# Patient Record
Sex: Female | Born: 1965 | Race: White | Hispanic: No | Smoking: Former smoker
Health system: Southern US, Community
[De-identification: ages and names within clinical notes are randomized; demographics above are authoritative.]

## PROBLEM LIST (undated history)

## (undated) DIAGNOSIS — F1120 Opioid dependence, uncomplicated: Secondary | ICD-10-CM

## (undated) DIAGNOSIS — G5600 Carpal tunnel syndrome, unspecified upper limb: Secondary | ICD-10-CM

## (undated) DIAGNOSIS — F4310 Post-traumatic stress disorder, unspecified: Secondary | ICD-10-CM

## (undated) DIAGNOSIS — G90529 Complex regional pain syndrome I of unspecified lower limb: Secondary | ICD-10-CM

## (undated) DIAGNOSIS — F17200 Nicotine dependence, unspecified, uncomplicated: Secondary | ICD-10-CM

## (undated) DIAGNOSIS — G894 Chronic pain syndrome: Secondary | ICD-10-CM

## (undated) HISTORY — DX: Chronic pain syndrome: G89.4

## (undated) HISTORY — DX: Opioid dependence, uncomplicated: F11.20

## (undated) HISTORY — DX: Nicotine dependence, unspecified, uncomplicated: F17.200

## (undated) HISTORY — DX: Complex regional pain syndrome i of unspecified lower limb: G90.529

## (undated) HISTORY — PX: COSMETIC SURGERY: SHX468

## (undated) HISTORY — DX: Post-traumatic stress disorder, unspecified: F43.10

## (undated) HISTORY — DX: Carpal tunnel syndrome, unspecified upper limb: G56.00

---

## 1989-06-17 HISTORY — PX: TUBAL LIGATION: SHX77

## 2005-09-18 ENCOUNTER — Emergency Department (HOSPITAL_COMMUNITY): Admission: EM | Admit: 2005-09-18 | Discharge: 2005-09-19 | Payer: Self-pay | Admitting: Emergency Medicine

## 2006-02-10 ENCOUNTER — Ambulatory Visit: Payer: Self-pay | Admitting: Family Medicine

## 2006-02-24 ENCOUNTER — Ambulatory Visit: Payer: Self-pay | Admitting: Family Medicine

## 2006-03-25 DIAGNOSIS — Z87891 Personal history of nicotine dependence: Secondary | ICD-10-CM | POA: Insufficient documentation

## 2006-03-25 DIAGNOSIS — F339 Major depressive disorder, recurrent, unspecified: Secondary | ICD-10-CM | POA: Insufficient documentation

## 2007-01-16 ENCOUNTER — Ambulatory Visit: Payer: Self-pay | Admitting: Family Medicine

## 2007-01-16 LAB — CONVERTED CEMR LAB
Nitrite: NEGATIVE
Specific Gravity, Urine: 1.01
WBC Urine, dipstick: NEGATIVE

## 2007-01-17 ENCOUNTER — Encounter: Payer: Self-pay | Admitting: Family Medicine

## 2007-01-21 ENCOUNTER — Encounter: Payer: Self-pay | Admitting: Family Medicine

## 2007-01-21 ENCOUNTER — Ambulatory Visit: Payer: Self-pay | Admitting: Family Medicine

## 2007-01-21 ENCOUNTER — Other Ambulatory Visit: Admission: RE | Admit: 2007-01-21 | Discharge: 2007-01-21 | Payer: Self-pay | Admitting: Family Medicine

## 2007-01-21 DIAGNOSIS — N951 Menopausal and female climacteric states: Secondary | ICD-10-CM | POA: Insufficient documentation

## 2007-01-28 ENCOUNTER — Ambulatory Visit: Payer: Self-pay | Admitting: Family Medicine

## 2007-02-02 ENCOUNTER — Encounter: Payer: Self-pay | Admitting: Family Medicine

## 2007-02-02 ENCOUNTER — Telehealth: Payer: Self-pay | Admitting: Family Medicine

## 2007-02-02 LAB — CONVERTED CEMR LAB
ALT: 15 units/L (ref 0–35)
Alkaline Phosphatase: 71 units/L (ref 39–117)
Creatinine, Ser: 0.9 mg/dL (ref 0.40–1.20)
Glucose, Bld: 118 mg/dL — ABNORMAL HIGH (ref 70–99)
HDL goal, serum: 40 mg/dL
LDL Cholesterol: 82 mg/dL (ref 0–99)
LH: 34.7 milliintl units/mL
MCHC: 34.4 g/dL (ref 30.0–36.0)
MCV: 97 fL (ref 78.0–100.0)
Platelets: 279 10*3/uL (ref 150–400)
Sodium: 143 meq/L (ref 135–145)
Total Bilirubin: 0.4 mg/dL (ref 0.3–1.2)
Total CHOL/HDL Ratio: 2.6
Total Protein: 6.8 g/dL (ref 6.0–8.3)
Triglycerides: 114 mg/dL (ref <150)
VLDL: 23 mg/dL (ref 0–40)

## 2007-02-24 ENCOUNTER — Ambulatory Visit: Payer: Self-pay | Admitting: Family Medicine

## 2007-02-24 ENCOUNTER — Encounter: Admission: RE | Admit: 2007-02-24 | Discharge: 2007-02-24 | Payer: Self-pay | Admitting: Family Medicine

## 2007-02-24 ENCOUNTER — Telehealth: Payer: Self-pay | Admitting: Family Medicine

## 2008-06-01 ENCOUNTER — Encounter: Payer: Self-pay | Admitting: Family Medicine

## 2008-06-01 ENCOUNTER — Ambulatory Visit: Payer: Self-pay | Admitting: Family Medicine

## 2008-06-01 ENCOUNTER — Other Ambulatory Visit: Admission: RE | Admit: 2008-06-01 | Discharge: 2008-06-01 | Payer: Self-pay | Admitting: Family Medicine

## 2008-06-02 ENCOUNTER — Encounter: Payer: Self-pay | Admitting: Family Medicine

## 2008-06-02 LAB — CONVERTED CEMR LAB: Trich, Wet Prep: NONE SEEN

## 2008-06-16 ENCOUNTER — Encounter: Payer: Self-pay | Admitting: Family Medicine

## 2008-06-20 LAB — CONVERTED CEMR LAB
Albumin: 4.3 g/dL (ref 3.5–5.2)
Chloride: 107 meq/L (ref 96–112)
Cholesterol: 208 mg/dL — ABNORMAL HIGH (ref 0–200)
Total Protein: 7 g/dL (ref 6.0–8.3)
Triglycerides: 104 mg/dL (ref <150)

## 2008-07-26 ENCOUNTER — Telehealth: Payer: Self-pay | Admitting: Family Medicine

## 2008-10-12 ENCOUNTER — Telehealth: Payer: Self-pay | Admitting: Family Medicine

## 2008-10-17 ENCOUNTER — Ambulatory Visit: Payer: Self-pay | Admitting: Family Medicine

## 2008-10-18 LAB — CONVERTED CEMR LAB
Iron: 115 ug/dL (ref 42–145)
Saturation Ratios: 46 % (ref 20–55)
TIBC: 251 ug/dL (ref 250–470)
TSH: 1.33 microintl units/mL (ref 0.350–4.500)
UIBC: 136 ug/dL

## 2008-10-21 ENCOUNTER — Encounter: Payer: Self-pay | Admitting: Family Medicine

## 2008-10-21 ENCOUNTER — Telehealth (INDEPENDENT_AMBULATORY_CARE_PROVIDER_SITE_OTHER): Payer: Self-pay | Admitting: *Deleted

## 2008-11-29 ENCOUNTER — Encounter: Payer: Self-pay | Admitting: Family Medicine

## 2009-04-17 ENCOUNTER — Encounter: Admission: RE | Admit: 2009-04-17 | Discharge: 2009-04-17 | Payer: Self-pay | Admitting: Family Medicine

## 2009-04-17 ENCOUNTER — Encounter: Payer: Self-pay | Admitting: Family Medicine

## 2009-04-17 LAB — HM MAMMOGRAPHY: HM Mammogram: NORMAL

## 2009-08-17 ENCOUNTER — Encounter: Payer: Self-pay | Admitting: Emergency Medicine

## 2009-08-17 ENCOUNTER — Inpatient Hospital Stay (HOSPITAL_COMMUNITY): Admission: EM | Admit: 2009-08-17 | Discharge: 2009-08-19 | Payer: Self-pay | Admitting: Emergency Medicine

## 2009-08-17 ENCOUNTER — Ambulatory Visit: Payer: Self-pay | Admitting: Diagnostic Radiology

## 2009-08-25 HISTORY — PX: KNEE SURGERY: SHX244

## 2009-10-23 ENCOUNTER — Ambulatory Visit (HOSPITAL_COMMUNITY): Admission: RE | Admit: 2009-10-23 | Discharge: 2009-10-24 | Payer: Self-pay | Admitting: Orthopedic Surgery

## 2010-01-10 ENCOUNTER — Ambulatory Visit (HOSPITAL_BASED_OUTPATIENT_CLINIC_OR_DEPARTMENT_OTHER): Admission: RE | Admit: 2010-01-10 | Discharge: 2010-01-10 | Payer: Self-pay | Admitting: Orthopedic Surgery

## 2010-01-23 ENCOUNTER — Ambulatory Visit: Payer: Self-pay | Admitting: Family Medicine

## 2010-01-23 DIAGNOSIS — F438 Other reactions to severe stress: Secondary | ICD-10-CM | POA: Insufficient documentation

## 2010-01-26 ENCOUNTER — Encounter (INDEPENDENT_AMBULATORY_CARE_PROVIDER_SITE_OTHER): Payer: Self-pay | Admitting: *Deleted

## 2010-01-26 ENCOUNTER — Telehealth: Payer: Self-pay | Admitting: Family Medicine

## 2010-03-07 ENCOUNTER — Ambulatory Visit: Payer: Self-pay | Admitting: Family Medicine

## 2010-04-27 ENCOUNTER — Telehealth: Payer: Self-pay | Admitting: Family Medicine

## 2010-06-17 ENCOUNTER — Encounter: Payer: Self-pay | Admitting: Family Medicine

## 2010-07-09 ENCOUNTER — Other Ambulatory Visit: Payer: Self-pay | Admitting: Obstetrics and Gynecology

## 2010-07-17 NOTE — Assessment & Plan Note (Signed)
Summary: DEPRESSED   Vital Signs:  Patient profile:   45 year old female Height:      65 inches Weight:      174 pounds BMI:     29.06 Pulse rate:   117 / minute BP sitting:   144 / 89  (left arm) Cuff size:   regular  Vitals Entered By: Kathlene November (January 23, 2010 2:44 PM) CC: pt extremely anxious and tearful- had 3 knee surgeries in 3 months- work harassing her in regards to this and states mentally she is disturbed   Primary Care Provider:  Linford Arnold, C  CC:  pt extremely anxious and tearful- had 3 knee surgeries in 3 months- work harassing her in regards to this and states mentally she is disturbed.  History of Present Illness: pt extremely anxious and tearful- had 3 knee surgeries in 3 months- work harassing her in regards to this and states mentally she is disturbed.  Dr. Merlyn Albert released her to go back to work.  Sits at work.  work has sent her home and changed her schedule.  Then she was sent to the workers comp doc at work.  He also recommend she is able to work and only needs PT. She is worried she is going to be fired. She is so stressed out she is not sleeping.  Her mind is racing.  Tried to get some mental health resources at work but it was denied.    Current Medications (verified): 1)  Methocarbamol 500 Mg Tabs (Methocarbamol) .... Take One Tablet By Mouth Once A Day 2)  Lyrica 75 Mg Caps (Pregabalin) .... Take One Tablet By Mouth Twice A Day 3)  Oxycodone Hcl 5 Mg Tabs (Oxycodone Hcl) .... Take One -Two Tablets By Mouth Every 6 Hours As Needed For Pain  Allergies (verified): 1)  ! Codeine  Physical Exam  General:  Well-developed,well-nourished,in no acute distress; alert,appropriate and cooperative throughout examination Msk:  INcision is healing well on the left knee. Some steri-strips still in place.  Neurologic:  limping becaue of left  knee Psych:  Oriented X3, normally interactive, good eye contact, tearful, and slightly anxious.     Impression &  Recommendations:  Problem # 1:  OTHER ACUTE REACTIONS TO STRESS (ICD-308.3) Discussed that she is definitely having an acute stress reaction to what is doing on at work.  Will refer to [facility] for counseling. Also discussed starting a medication. Start citalprom. Warned about potential SE. F/U in 3 weeks. Written out of work for the rest of this week.. As emotionally she needs a break and is clearly very overwhelmed.   Orders: Psychology Referral (Psychology)  Complete Medication List: 1)  Methocarbamol 500 Mg Tabs (Methocarbamol) .... Take one tablet by mouth once a day 2)  Lyrica 75 Mg Caps (Pregabalin) .... Take one tablet by mouth twice a day 3)  Oxycodone Hcl 5 Mg Tabs (Oxycodone hcl) .... Take one -two tablets by mouth every 6 hours as needed for pain 4)  Citalopram Hydrobromide 20 Mg Tabs (Citalopram hydrobromide) .... 1/2 tab daily by mouth for one week, then increase to whole tab daily.  Patient Instructions: 1)  Please schedule a follow-up appointment in 3 weeks to se if the medication is helping.  Prescriptions: CITALOPRAM HYDROBROMIDE 20 MG TABS (CITALOPRAM HYDROBROMIDE) 1/2 tab daily by mouth for one week, then increase to whole tab daily.  #30 x 1   Entered and Authorized by:   Nani Gasser MD   Signed by:  Nani Gasser MD on 01/23/2010   Method used:   [facility] [phone][facility] (retail[facility]       [address]       [place], [place]  [address]       Ph: [phone] or [phone]       Fax: [phone]   RxID:   [id number]

## 2010-07-17 NOTE — Progress Notes (Signed)
Summary: meds  Phone Note Call from Patient   Caller: Patient Call For: Nani Gasser MD Summary of Call: pt called and states she wants a refill on chantix.[facility] she states they "over[facility]" her and she doesnt want to deal with them anymore and wants a refill sent to [facility] Initial call taken by: Avon Gully CMA, Duncan Dull),  April 27, 2010 2:51 PM    Prescriptions: CHANTIX CONTINUING MONTH PAK 1 MG TABS (VARENICLINE TARTRATE) take as directed  #1 month x 2   Entered and Authorized by:   Nani Gasser MD   Signed by:   Nani Gasser MD on 04/27/2010   Method used:   Electronically to        [facility] [phone]* (retail)       [address] [place], [place]  [address]       Ph: [phone]       Fax: [phone]   RxID:   [id number]

## 2010-07-17 NOTE — Progress Notes (Signed)
Summary: meds not working  Phone Note Call from Patient   Caller: Patient Call For: Nani Gasser MD Summary of Call: pt states the citalapram 20 mg is not working.Pt is having anxiety and heart palpitations.Wants to know what eles she can take.Pt is having bad nightmares and cant sleep Initial call taken by: Avon Gully CMA, Duncan Dull),  January 26, 2010 2:26 PM  Follow-up for Phone Call        Keep taking the citalpram as he likely hasn't started working but for  rescue med can start klonopin.  Can use this safely with the ciralopram.  Follow-up by: Nani Gasser MD,  January 26, 2010 2:59 PM  Additional Follow-up for Phone Call Additional follow up Details #1::        pt notified.pt wants a note to be out of work until wed Additional Follow-up by: Avon Gully CMA, Duncan Dull),  January 26, 2010 3:11 PM    Additional Follow-up for Phone Call Additional follow up Details #2::    That is fine.  Follow-up by: Nani Gasser MD,  January 26, 2010 3:23 PM  New/Updated Medications: CLONAZEPAM 0.5 MG TBDP (CLONAZEPAM) Take 1 tablet by mouth two times a day as needed for anxiety Prescriptions: CLONAZEPAM 0.5 MG TBDP (CLONAZEPAM) Take 1 tablet by mouth two times a day as needed for anxiety  #30 x 0   Entered and Authorized by:   Nani Gasser MD   Signed by:   Nani Gasser MD on 01/26/2010   Method used:   Printed then faxed to ...       [facility]  [address] [facility] [phone][facility]       [address]       [place], [place]  [address]       Ph: [phone] or [phone]       Fax: [phone]   RxID:   [id number]

## 2010-07-17 NOTE — Assessment & Plan Note (Signed)
Summary: bronchitis/ smoking   Vital Signs:  Patient profile:   45 year old female Height:      65 inches Weight:      175 pounds BMI:     29.23 O2 Sat:      97 % on Room air Pulse rate:   87 / minute BP sitting:   145 / 83  (left arm) Cuff size:   regular  Vitals Entered By: Payton Spark CMA (March 07, 2010 1:34 PM)  O2 Flow:  Room air CC: Cough, congestion, fever and chills.   Primary Care Jamiya Nims:  Nani Gasser MD  CC:  Cough, congestion, and fever and chills..  History of Present Illness: 45 yo WF presents for sore throat and chest congestion that started yesterday.  Taking Robitussin.  Has diffuse bodyaches.  She feels really tired.  She has thick rhinorrhea and productive cough.She has chest tightness.  Her cough is keeping her up at night.  She is a smoker.  Has more SOB.  She has fevers and chills.  No sick contacts.  Denies any GI upset.    Current Medications (verified): 1)  Methocarbamol 500 Mg Tabs (Methocarbamol) .... Take One Tablet By Mouth Once A Day 2)  Lyrica 75 Mg Caps (Pregabalin) .... Take One Tablet By Mouth Twice A Day 3)  Oxycodone Hcl 5 Mg Tabs (Oxycodone Hcl) .... Take One -Two Tablets By Mouth Every 6 Hours As Needed For Pain 4)  Clonazepam 0.5 Mg Tbdp (Clonazepam) .... Take 1 Tablet By Mouth Two Times A Day As Needed For Anxiety  Allergies (verified): 1)  ! Codeine  Past History:  Past Medical History: smoker  Social History: Reviewed history from 03/25/2006 and no changes required. Supervisor at [employer].  12 yrs education.  Married to [place] with 5 kids.  Smokes 1ppd, no drugs, occ EtOH, no drugs, 4 caff/day, walks for 30 min 2-3 x weekly.  Review of Systems      See HPI  Physical Exam  General:  alert, well-developed, well-nourished, and well-hydrated.   Head:  normocephalic and atraumatic.  sinuses NTTP Eyes:  conjunctiva clear Ears:  EACs patent; TMs translucent and gray with good cone of light and bony  landmarks.  Nose:  no rhinorrhea Mouth:  throat mildly injected Neck:  no masses.   Lungs:  Normal respiratory effort, chest expands symmetrically. bibasilar rhonchi.   Heart:  Normal rate and regular rhythm. S1 and S2 normal without gallop, murmur, click, rub or other extra sounds. Skin:  color normal.   Cervical Nodes:  No lymphadenopathy noted Psych:  good eye contact, not anxious appearing, and not depressed appearing.     Impression & Recommendations:  Problem # 1:  ACUTE BRONCHITIS (ICD-466.0) With wheezing, hx of smoking. Will cover her with Zithromax x 5 days.  Avoid smoking.  Use OTC Dayquil/ Nyquil. Start ProAir 4 x a day.  Demonstrated proper use of HFA.   Her updated medication list for this problem includes:    Azithromycin 250 Mg Tabs (Azithromycin) .Marland Kitchen... 2 tabs by mouth x 1 today then 1 tab by mouth daily x 4 days    Proair Hfa 108 (90 Base) Mcg/act Aers (Albuterol sulfate) .Marland Kitchen... 2 puffs 4 x a day for 1 wk  Problem # 2:  TOBACCO DEPENDENCE (ICD-305.1) Counseled x 5 min on smoking cessation.   She really wants to retry Chantix and is ready to quit. I did fill her both Chantix starting and continuing month packs today. Advise  f/u 4 wks after new start. Her updated medication list for this problem includes:    Chantix Starting Month Pak 0.5 Mg X 11 & 1 Mg X 42 Tabs (Varenicline tartrate) .Marland Kitchen... Take as directed    Chantix Continuing Month Pak 1 Mg Tabs (Varenicline tartrate) .Marland Kitchen... Take as directed  Complete Medication List: 1)  Methocarbamol 500 Mg Tabs (Methocarbamol) .... Take one tablet by mouth once a day 2)  Lyrica 75 Mg Caps (Pregabalin) .... Take one tablet by mouth twice a day 3)  Oxycodone Hcl 5 Mg Tabs (Oxycodone hcl) .... Take one -two tablets by mouth every 6 hours as needed for pain 4)  Clonazepam 0.5 Mg Tbdp (Clonazepam) .... Take 1 tablet by mouth two times a day as needed for anxiety 5)  Azithromycin 250 Mg Tabs (Azithromycin) .... 2 tabs by mouth x 1  today then 1 tab by mouth daily x 4 days 6)  Proair Hfa 108 (90 Base) Mcg/act Aers (Albuterol sulfate) .... 2 puffs 4 x a day for 1 wk 7)  Chantix Starting Month Pak 0.5 Mg X 11 & 1 Mg X 42 Tabs (Varenicline tartrate) .... Take as directed 8)  Chantix Continuing Month Pak 1 Mg Tabs (Varenicline tartrate) .... Take as directed  Patient Instructions: 1)  For bronchitis: 2)  Avoid smoking. 3)  Take 5 days of Azithromycin. 4)  Use ProAir inhaler 2 puffs 4 x a day for the next wk. 5)  Use OTC Dayquil/ Nyquil for symptom relief. 6)  Start on Chantix when you are ready to quit smoking. 7)  F/U with Dr Linford Arnold 4 wks after you start this. Prescriptions: CHANTIX CONTINUING MONTH PAK 1 MG TABS (VARENICLINE TARTRATE) take as directed  #1 month x 2   Entered and Authorized by:   Seymour Bars DO   Signed by:   Seymour Bars DO on 03/07/2010   Method used:   [facility]        [id number]       [address] [place], [place]  [address]       Ph: [phone] or [phone]       Fax: [phone]   RxID:   [id number] CHANTIX STARTING MONTH PAK 0.5 MG X 11 & 1 MG X 42 TABS (VARENICLINE TARTRATE) take as directed  #1 pack x 0   Entered and Authorized by:   Seymour Bars DO   Signed by:   Seymour Bars DO on 03/07/2010   Method used:   [facility]        [id number]       [address] [place], [place]  [address]       Ph: [phone] or [phone]       Fax: [phone]   RxID:   [id number] AZITHROMYCIN 250 MG TABS (AZITHROMYCIN) 2 tabs by mouth x 1 today then 1 tab by mouth daily x 4 days  #6 tabs x 0   Entered and Authorized by:   Seymour Bars DO   Signed by:   Seymour Bars DO on 03/07/2010   Method used:   [facility]        [id number]       [address]  [place], [place]  [address]       Ph: [phone] or [phone]       Fax: [phone]   RxID:    [id number]

## 2010-07-17 NOTE — Letter (Signed)
Summary: Out of Work  [facility]  [address]   [place], [place] [address]   Phone: [phone]  Fax: [phone]    January 26, 2010   Employee:  VIERA OKONSKI    To Whom It May Concern:   For Medical reasons, please excuse the above named employee from work for the following dates:  Start:   01/26/10  End:   01/31/10  If you need additional information, please feel free to contact our office.         Sincerely,    Nani Gasser, MD

## 2010-07-17 NOTE — Letter (Signed)
Summary: Out of Work  [facility]  [address], [address]   [place], [place] [address]   Phone: [phone]  Fax: [phone]    January 23, 2010   Employee:  LORAINA STAUFFER    To Whom It May Concern:   For Medical reasons, please excuse the above named employee from work for the following dates:  Start:   01-23-2010  End:   01-29-2010  If you need additional information, please feel free to contact our office.         Sincerely,    Nani Gasser MD

## 2010-08-06 ENCOUNTER — Encounter: Payer: Self-pay | Admitting: Family Medicine

## 2010-08-06 ENCOUNTER — Encounter (INDEPENDENT_AMBULATORY_CARE_PROVIDER_SITE_OTHER): Payer: 59 | Admitting: Family Medicine

## 2010-08-06 DIAGNOSIS — Z0000 Encounter for general adult medical examination without abnormal findings: Secondary | ICD-10-CM

## 2010-08-06 DIAGNOSIS — Z111 Encounter for screening for respiratory tuberculosis: Secondary | ICD-10-CM

## 2010-08-06 DIAGNOSIS — Z23 Encounter for immunization: Secondary | ICD-10-CM

## 2010-08-07 LAB — CONVERTED CEMR LAB
AST: 16 units/L (ref 0–37)
Albumin: 4.7 g/dL (ref 3.5–5.2)
BUN: 16 mg/dL (ref 6–23)
Calcium: 10.2 mg/dL (ref 8.4–10.5)
Chloride: 106 meq/L (ref 96–112)
Glucose, Bld: 96 mg/dL (ref 70–99)
HDL: 61 mg/dL (ref >39)
Potassium: 4.8 meq/L (ref 3.5–5.3)

## 2010-08-14 NOTE — Assessment & Plan Note (Signed)
Summary: CPE   Vital Signs:  Patient profile:   45 year old female Height:      65 inches Weight:      176 pounds Pulse rate:   95 / minute BP sitting:   131 / 85  (right arm) Cuff size:   regular  Vitals Entered By: Avon Gully CMA, Duncan Dull) (August 06, 2010 9:40 AM) CC: CPE,form to be filled out   Primary Care Provider:  Nani Gasser MD  CC:  CPE and form to be filled out.  History of Present Illness: Here for CPE today. Doing well. Still struggling with her left knee. Had 3 surgeries. She is now seeing a new ortho and has an MRI coming up. Has gained some weight secondary to inactivity.   Current Medications (verified): 1)  Oxycodone Hcl 5 Mg Tabs (Oxycodone Hcl) .... Take One -Two Tablets By Mouth Every 6 Hours As Needed For Pain 2)  Clonazepam 0.5 Mg Tbdp (Clonazepam) .... Take 1 Tablet By Mouth Two Times A Day As Needed For Anxiety 3)  Proair Hfa 108 (90 Base) Mcg/act Aers (Albuterol Sulfate) .... 2 Puffs 4 X A Day For 1 Wk 4)  Chantix Starting Month Pak 0.5 Mg X 11 & 1 Mg X 42 Tabs (Varenicline Tartrate) .... Take As Directed 5)  Chantix Continuing Month Pak 1 Mg Tabs (Varenicline Tartrate) .... Take As Directed  Allergies (verified): 1)  ! Codeine  Comments:  Nurse/Medical Assistant: The patient's medications and allergies were reviewed with the patient and were updated in the Medication and Allergy Lists. Avon Gully CMA, Duncan Dull) (August 06, 2010 9:41 AM)  Past History:  Past Medical History: Last updated: 03/07/2010 smoker  Family History: Last updated: 06/01/2008 alcoholim-father,  Depression-uncle, sister,  HTN-mother, Father,  MI-father,  Ovarian CA-GM  Social History: Last updated: 08/06/2010 No longer working.  12 yrs education.  Married to [place] with 5 adult kids.  Smokes 1ppd, no drugs, occ EtOH, no drugs, 4 caff/day, walks for 30 min 2-3 x weekly.  Past Surgical History: Br Augmentation  1995, 2002, c/sec 1981, 1985, tubal  ligation  1991 3 knee surgeries.   Family History: Reviewed history from 06/01/2008 and no changes required. alcoholim-father,  Depression-uncle, sister,  HTN-mother, Father,  MI-father,  Ovarian CA-GM  Social History: No longer working.  12 yrs education.  Married to [place] with 5 adult kids.  Smokes 1ppd, no drugs, occ EtOH, no drugs, 4 caff/day, walks for 30 min 2-3 x weekly.  Review of Systems  The patient denies anorexia, fever, weight loss, weight gain, vision loss, decreased hearing, hoarseness, chest pain, syncope, dyspnea on exertion, peripheral edema, prolonged cough, headaches, hemoptysis, abdominal pain, melena, hematochezia, severe indigestion/heartburn, hematuria, incontinence, genital sores, muscle weakness, suspicious skin lesions, transient blindness, difficulty walking, depression, unusual weight change, abnormal bleeding, enlarged lymph nodes, and breast masses.    Physical Exam  General:  Well-developed,well-nourished,in no acute distress; alert,appropriate and cooperative throughout examination Head:  Normocephalic and atraumatic without obvious abnormalities. No apparent alopecia or balding. Eyes:  No corneal or conjunctival inflammation noted. EOMI. Perrla.  Ears:  External ear exam shows no significant lesions or deformities.  Otoscopic examination reveals clear canals, tympanic membranes are intact bilaterally without bulging, retraction, inflammation or discharge. Hearing is grossly normal bilaterally. Nose:  External nasal examination shows no deformity or inflammation. Nasal mucosa are pink and moist without lesions or exudates. Mouth:  Oral mucosa and oropharynx without lesions or exudates.  Teeth in good repair. Neck:  No  deformities, masses, or tenderness noted. Lungs:  Normal respiratory effort, chest expands symmetrically. Lungs are clear to auscultation, no crackles or wheezes. Heart:  Normal rate and regular rhythm. S1 and S2 normal without gallop,  murmur, click, rub or other extra sounds. Abdomen:  Bowel sounds positive,abdomen soft and non-tender without masses, organomegaly or hernias noted. Msk:  No deformity or scoliosis noted of thoracic or lumbar spine.   Pulses:  R and L carotid,radial,dorsalis pedis and posterior tibial pulses are full and equal bilaterally Extremities:  No clubbing, cyanosis, edema, or deformity noted with normal full range of motion of all joints.   Neurologic:  No cranial nerve deficits noted. Station and gait are normal. Plantar reflexes are down-going bilaterally. DTRs are symmetrical throughout, excpet the left knee. Sensory, motor and coordinative functions appear intact. Skin:  no rashes.   Cervical Nodes:  No lymphadenopathy noted Psych:  Cognition and judgment appear intact. Alert and cooperative with normal attention span and concentration. No apparent delusions, illusions, hallucinations   Impression & Recommendations:  Problem # 1:  HEALTH MAINTENANCE EXAM (ICD-V70.0)  Encourage smoking cessation.  Try to work on [employer] Completed form for foster parent. Did note her knee pain.  Tdap updated today. TB skin test placed.   Orders: T-Comprehensive Metabolic Panel [phone]) T-Lipid Profile [id number])  Complete Medication List: 1)  Oxycodone Hcl 5 Mg Tabs (Oxycodone hcl) .... Take one -two tablets by mouth every 6 hours as needed for pain 2)  Clonazepam 0.5 Mg Tbdp (Clonazepam) .... Take 1 tablet by mouth two times a day as needed for anxiety 3)  Proair Hfa 108 (90 Base) Mcg/act Aers (Albuterol sulfate) .... 2 puffs 4 x a day for 1 wk 4)  Chantix Starting Month Pak 0.5 Mg X 11 & 1 Mg X 42 Tabs (Varenicline tartrate) .... Take as directed 5)  Chantix Continuing Month Pak 1 Mg Tabs (Varenicline tartrate) .... Take as directed  Other Orders: TB Skin Test [id number]) Admin 1st Vaccine (13086) Tdap => 63yrs IM (57846) Admin of Any Addtl Vaccine (96295)  Patient Instructions: 1)  Work on  progressing activity as able. Work on [employer].   2)  Take calcium +vitamin D daily.  3)  Stop smoking tips: Choose a quit date. Cut down before the quit date. Decide what you will do as a substitute when you feel the urge to smoke(gum, toothpick, exercise).     Orders Added: 1)  T-Comprehensive Metabolic Panel [[id number]] 2)  T-Lipid Profile [[id number]] 3)  TB Skin Test [86580] 4)  Admin 1st Vaccine [90471] 5)  Tdap => 98yrs IM [90715] 6)  Admin of Any Addtl Vaccine [90472] 7)  Est. Patient age 81-64 [26]   Immunizations Administered:  PPD Skin Test:    Vaccine Type: PPD    Site: right forearm    Mfr: Sanofi Pasteur    Dose: 0.1 ml    Route: ID    Given by: Sue Lush McCrimmon CMA, (AAMA)    Exp. Date: 02/16/2012    Lot #: [id number]  Tetanus Vaccine:    Vaccine Type: Tdap    Site: right deltoid    Mfr: GlaxoSmithKline    Dose: 0.5 ml    Route: IM    Given by: Sue Lush McCrimmon CMA, (AAMA)    Exp. Date: 04/05/2012    Lot #: [id number]    VIS given: 05/04/08 version given August 06, 2010.   Immunizations Administered:  PPD Skin Test:    Vaccine Type: PPD    Site:  right forearm    Mfr: Sanofi Pasteur    Dose: 0.1 ml    Route: ID    Given by: Sue Lush McCrimmon CMA, (AAMA)    Exp. Date: 02/16/2012    Lot #: [id number]  Tetanus Vaccine:    Vaccine Type: Tdap    Site: right deltoid    Mfr: GlaxoSmithKline    Dose: 0.5 ml    Route: IM    Given by: Sue Lush McCrimmon CMA, (AAMA)    Exp. Date: 04/05/2012    Lot #: [id number]    VIS given: 05/04/08 version given August 06, 2010.   Preventive Care Screening  Mammogram:    Date:  06/17/2010    Next Due:  06/2011    Results:  normal   Pap Smear:    Date:  06/17/2010    Next Due:  06/2011    Results:  normal

## 2010-09-01 LAB — POCT HEMOGLOBIN-HEMACUE: Hemoglobin: 15.3 g/dL — ABNORMAL HIGH (ref 12.0–15.0)

## 2010-09-04 LAB — COMPREHENSIVE METABOLIC PANEL
AST: 17 U/L (ref 0–37)
Albumin: 4.2 g/dL (ref 3.5–5.2)
Calcium: 9.2 mg/dL (ref 8.4–10.5)
Creatinine, Ser: 0.68 mg/dL (ref 0.4–1.2)
GFR calc Af Amer: >60 mL/min (ref >60)
Total Protein: 6.9 g/dL (ref 6.0–8.3)

## 2010-09-04 LAB — URINALYSIS, ROUTINE W REFLEX MICROSCOPIC
Bilirubin Urine: NEGATIVE
Nitrite: NEGATIVE
Specific Gravity, Urine: 1.026 (ref 1.005–1.030)
pH: 5.5 (ref 5.0–8.0)

## 2010-09-04 LAB — CBC
MCHC: 33.6 g/dL (ref 30.0–36.0)
MCV: 94.9 fL (ref 78.0–100.0)
Platelets: 253 10*3/uL (ref 150–400)
RDW: 14 % (ref 11.5–15.5)
WBC: 10.1 10*3/uL (ref 4.0–10.5)

## 2010-09-04 LAB — URINE MICROSCOPIC-ADD ON

## 2010-09-04 LAB — PROTIME-INR: INR: 0.98 (ref 0.00–1.49)

## 2010-09-04 LAB — APTT: aPTT: 37 seconds (ref 24–37)

## 2010-09-10 LAB — CBC
HCT: 44.4 % (ref 36.0–46.0)
MCV: 95.8 fL (ref 78.0–100.0)
RBC: 4.64 MIL/uL (ref 3.87–5.11)
WBC: 12.6 10*3/uL — ABNORMAL HIGH (ref 4.0–10.5)

## 2010-09-10 LAB — BASIC METABOLIC PANEL
Chloride: 101 mEq/L (ref 96–112)
GFR calc Af Amer: >60 mL/min (ref >60)
GFR calc non Af Amer: >60 mL/min (ref >60)
Potassium: 4.6 mEq/L (ref 3.5–5.1)

## 2010-09-10 LAB — DIFFERENTIAL
Eosinophils Absolute: 0.1 10*3/uL (ref 0.0–0.7)
Eosinophils Relative: 0 % (ref 0–5)
Lymphocytes Relative: 22 % (ref 12–46)
Lymphs Abs: 2.8 10*3/uL (ref 0.7–4.0)
Monocytes Relative: 7 % (ref 3–12)
Neutrophils Relative %: 70 % (ref 43–77)

## 2010-09-10 LAB — PROTIME-INR: INR: 1.02 (ref 0.00–1.49)

## 2010-11-15 ENCOUNTER — Ambulatory Visit (INDEPENDENT_AMBULATORY_CARE_PROVIDER_SITE_OTHER): Payer: 59 | Admitting: Family Medicine

## 2010-11-15 ENCOUNTER — Encounter: Payer: Self-pay | Admitting: Family Medicine

## 2010-11-15 DIAGNOSIS — G5600 Carpal tunnel syndrome, unspecified upper limb: Secondary | ICD-10-CM

## 2010-11-15 NOTE — Patient Instructions (Signed)
Start your anti-inflammatory for the next 10-14 days.  Wear your brace at night and only during the day if you are going to do a repeatative activity. Follow up in 3 weeks.

## 2010-11-15 NOTE — Progress Notes (Signed)
  Subjective:    Patient ID: Heather Dennis, female    DOB: 02-21-1966, 45 y.o.   MRN: [id number]  HPI For 2 weeks her hand her hand has been waking her up at night.  Has woken up with it swelling.  No trauma ow known repetitive motions. The thumb and first 3 fingers hurt and go numb.  Occ shooting pains to her elbow.  She does have pain management for her knee.  She really wants another opinoin on her knee and has been trying to get in at [facility]. No exacerbating sxs or relieving sxs. She is on oxycontin for her knee and yet still having severe pain in her right wrist.  She is not currently on an NSAID.    Review of Systems     Objective:   Physical Exam    Right hand with NROM.  + phalens and tinnels sign.  Shoulder, elbow and fingers with NROM.  Sternght is 5/5 in the wrist, fingers, elbow, and shoulder.  No lesions or swelling.      Assessment & Plan:  Right hand carpal tunnel discussed standard tx. Will start with a splint at bedtime and an antiinflammatory. She says she has an NSAID at home from her ortho so can use this for th enext 10-14 with water and food.  F/U in 3 weeks to see if improving. Also can wear splint if doing a repeatative motion during the daytime. She is already on pain meds. Also explained if not getting better then may need to consider injection or PT.

## 2010-11-16 ENCOUNTER — Encounter: Payer: Self-pay | Admitting: Family Medicine

## 2010-11-19 ENCOUNTER — Telehealth: Payer: Self-pay | Admitting: Family Medicine

## 2010-11-19 DIAGNOSIS — G5600 Carpal tunnel syndrome, unspecified upper limb: Secondary | ICD-10-CM

## 2010-11-19 MED ORDER — PIROXICAM 20 MG PO CAPS: 20.0000 mg | ORAL_CAPSULE | Freq: Every day | ORAL | Status: DC

## 2010-11-19 NOTE — Telephone Encounter (Signed)
Pt called and said her carpel tunnel of her hand is not getting any better.  Has methcarbomol that she is using every 8 hours but not working.  Can we suggest and prescribe a different anti-inflammatory. Plan:  Routed to Dr. Linford Arnold for review Jarvis Newcomer, LPN Domingo Dimes

## 2010-11-19 NOTE — Telephone Encounter (Signed)
Pt called to try to find out the ortho group she sees as ordered by Dr. Linford Arnold.  Had to [facility].  Pending pt call back. Jarvis Newcomer, LPN Domingo Dimes

## 2010-11-19 NOTE — Telephone Encounter (Signed)
Can you find out out what ortho group she already sees.

## 2010-11-19 NOTE — Telephone Encounter (Signed)
I sent new rx in . Heather Dennis will you get an up to date med list. Pt was supposed to call us back with it but didn't. Also does she want me to go ahead and refer her or not?

## 2010-11-19 NOTE — Telephone Encounter (Signed)
Pt notified and told to stop the methcarbimol 500mg , and Dr. Linford Arnold was going to send Feldene to her pharmacy instead.  Pt does wish to be referred for her carpal tunnel because it is not getting any better.  Problem bilaterally, but RT worse.  Med list updated as ordered. Plan:  Routed to Dr. Marlyne Beards, LPN Domingo Dimes

## 2010-11-20 NOTE — Telephone Encounter (Signed)
Pt called back and said she had not been pleased with either ortho provider she had seen in the past in [place] and does not wish to return.  Pt would like Dr. Linford Arnold to recommend someone.  Pt also stated she would like to go to [facility] ortho provider possibly. Plan:  Routed to Dr. Marlyne Beards, LPN Domingo Dimes

## 2010-11-20 NOTE — Telephone Encounter (Signed)
Pt said she had done some calling around and said if Dr. Linford Arnold couldn't come up with ortho she would prefer to see Dr. Elliot Dally @ [facility]. Plan:  Routed to Dr. Marlyne Beards, LPN Domingo Dimes

## 2010-11-20 NOTE — Telephone Encounter (Signed)
Pt notified that a referral was made for her to Dr. Daphine Deutscher at [facility]. Jarvis Newcomer, LPN [facility]

## 2010-11-20 NOTE — Telephone Encounter (Signed)
Referral placed for Dr. Daphine Deutscher at [facility].

## 2010-11-20 NOTE — Telephone Encounter (Signed)
Left another message for the pt and that she needs to call us with the ortho grp see sees. Jarvis Newcomer, LPN [facility]

## 2011-03-07 ENCOUNTER — Other Ambulatory Visit: Payer: Self-pay | Admitting: *Deleted

## 2011-03-07 DIAGNOSIS — M545 Low back pain, unspecified: Secondary | ICD-10-CM

## 2011-03-12 ENCOUNTER — Ambulatory Visit
Admission: RE | Admit: 2011-03-12 | Discharge: 2011-03-12 | Disposition: A | Discharge status: Home / Self Care | Payer: 59 | Source: Ambulatory Visit | Attending: *Deleted | Admitting: *Deleted

## 2011-03-12 DIAGNOSIS — M545 Low back pain, unspecified: Secondary | ICD-10-CM

## 2011-03-14 DIAGNOSIS — G894 Chronic pain syndrome: Secondary | ICD-10-CM

## 2011-03-14 HISTORY — DX: Chronic pain syndrome: G89.4

## 2011-05-30 ENCOUNTER — Ambulatory Visit (INDEPENDENT_AMBULATORY_CARE_PROVIDER_SITE_OTHER): Payer: 59 | Admitting: Family Medicine

## 2011-05-30 ENCOUNTER — Encounter: Payer: Self-pay | Admitting: Family Medicine

## 2011-05-30 VITALS — BP 134/89 | HR 90 | Ht 65.0 in | Wt 173.0 lb

## 2011-05-30 DIAGNOSIS — F17200 Nicotine dependence, unspecified, uncomplicated: Secondary | ICD-10-CM

## 2011-05-30 DIAGNOSIS — Z720 Tobacco use: Secondary | ICD-10-CM

## 2011-05-30 DIAGNOSIS — F32A Depression, unspecified: Secondary | ICD-10-CM

## 2011-05-30 DIAGNOSIS — G8929 Other chronic pain: Secondary | ICD-10-CM

## 2011-05-30 DIAGNOSIS — F329 Major depressive disorder, single episode, unspecified: Secondary | ICD-10-CM

## 2011-05-30 DIAGNOSIS — M545 Low back pain, unspecified: Secondary | ICD-10-CM

## 2011-05-30 MED ORDER — VARENICLINE TARTRATE 0.5 MG PO TABS: 0.5000 mg | ORAL_TABLET | Freq: Two times a day (BID) | ORAL | Status: DC

## 2011-05-30 MED ORDER — DULOXETINE HCL 30 MG PO CPEP: 30.0000 mg | ORAL_CAPSULE | Freq: Every day | ORAL | Status: DC

## 2011-05-30 MED ORDER — GABAPENTIN 100 MG PO CAPS: 100.0000 mg | ORAL_CAPSULE | Freq: Every day | ORAL | Status: DC

## 2011-05-30 NOTE — Progress Notes (Signed)
  Subjective:    Patient ID: Heather Dennis, female    DOB: 1966-04-09, 45 y.o.   MRN: [id number]  HPI Left knee pain-since I last saw her she did get a second opinion from a surgeon at [facility]. She also was evaluated for her carpal tunnel. She evidently had EMG studies which showed severity of enough to warrant surgery. That he did not want to perform surgery on her at this time because he felt that her nerve the system was out of control. Most likely he felt like she would not be a good surgical candidate because of her chronic pain with her knee post surgery. She has had 3 surgeries on her left knee  With Dr. Antony Odea and unfortunately has not had the results. She has decreased mobility in flexion and significant pain. She is now being followed at pain management and is on OxyContin 3 times a day. She now says she feels depressed and saddened. Her life has completely changed. She can now walk or help take care of her husband like she used to. She feels she doesn't want to get out of the house a lot of days. And so she spends a lot of days in bed because of her pain. She has also been seeing a chiropractor for her chronic low back pain. She was also recently diagnosed with bursitis in her left hip. Sleep quality is poor secondary to discomfort. She says her pain is worse when the weather changes.  Tobacco abuse-she is very interested in quitting smoking again. Her husband is also planning on quitting. She did take the Chantix in the past and it worked well but caused significant nausea.   Review of Systems     Objective:   Physical Exam  Constitutional: She is oriented to person, place, and time. She appears well-developed and well-nourished.  HENT:  Head: Normocephalic and atraumatic.  Neurological: She is alert and oriented to person, place, and time.  Psychiatric: She has a normal mood and affect. Her behavior is normal.          Assessment & Plan:  Depression-we've discussed that she is  clearly depressed. This has really made a big impact on her daily life. She is no longer able to work. She spends a lot of days and bad. We discussed that we need to treat the depression is that as part of the healing process for her nerves. We will start Cymbalta 30 mg and I will see her back in about a month. I am hoping this will also help with her chronic low back pain.  Chronic low back pain-I am hoping the Cymbalta will help her. We will also try Neurontin 100 mg at bedtime which is a very low dose I want to make sure she tolerates it well. If she is doing okay at the followup we will increase to 300 mg. This may also help her sleep.  tob abuse-discussed keeping her on the lowest dose of Chantix and see if this helps her. We will definitely need to monitor her for worsening mood on the medication. She is taking this about a year ago and did well with it at that time. I am excited that she and her husband are ready to quit smoking. I also think this would help her healing process, if she were able to quit.  25 minutes spent face-to-face discussion and counseling.

## 2011-06-21 ENCOUNTER — Ambulatory Visit (INDEPENDENT_AMBULATORY_CARE_PROVIDER_SITE_OTHER): Payer: 59 | Admitting: Family Medicine

## 2011-06-21 ENCOUNTER — Encounter: Payer: Self-pay | Admitting: Family Medicine

## 2011-06-21 VITALS — BP 142/80 | HR 101 | Temp 98.2°F | Wt 172.0 lb

## 2011-06-21 DIAGNOSIS — F32A Depression, unspecified: Secondary | ICD-10-CM

## 2011-06-21 DIAGNOSIS — F329 Major depressive disorder, single episode, unspecified: Secondary | ICD-10-CM

## 2011-06-21 DIAGNOSIS — Z720 Tobacco use: Secondary | ICD-10-CM

## 2011-06-21 DIAGNOSIS — F17200 Nicotine dependence, unspecified, uncomplicated: Secondary | ICD-10-CM

## 2011-06-21 DIAGNOSIS — J111 Influenza due to unidentified influenza virus with other respiratory manifestations: Secondary | ICD-10-CM

## 2011-06-21 MED ORDER — DULOXETINE HCL 60 MG PO CPEP: 60.0000 mg | ORAL_CAPSULE | Freq: Every day | ORAL | Status: DC

## 2011-06-21 MED ORDER — VARENICLINE TARTRATE 1 MG PO TABS: 1.0000 mg | ORAL_TABLET | Freq: Two times a day (BID) | ORAL | Status: AC

## 2011-06-21 NOTE — Progress Notes (Signed)
  Subjective:    Patient ID: Heather Dennis, female    DOB: 05-04-1966, 46 y.o.   MRN: [id number]  HPI She is dong well on the cymbalta.  She feels a littlr more motivate in the morning.Says less pain in the morning when first gets out of bed.   Says still using oxycontin.  She is sleeping well.  Still having hand swelling.  Still has more pain when weather changes.  She still tries to get up and cut for her family and says this is when her back pain tends to increase. She saw her pain management physician earlier this week.  Tob abuse - on the low dose chantix. She wants to increase her dose.  Last time she was on higher dose she did get nauseated but says she has been unable to completely quit on the lower dose. She would like a new prescription sent.  She has the flu right now.  She's had cough, chills, fever and congestion. Her husband has been sick as well. He was given Tamiflu. She has not taken any medications or been seen for this before. She is starting to feel some better. She did get her flu shot in early December.  Review of Systems     Objective:   Physical Exam  Constitutional: She is oriented to person, place, and time. She appears well-developed and well-nourished.  Cardiovascular: Normal rate, regular rhythm and normal heart sounds.   Pulmonary/Chest: Effort normal and breath sounds normal.  Neurological: She is alert and oriented to person, place, and time.  Skin: Skin is warm and dry.  Psychiatric: She has a normal mood and affect. Her behavior is normal.          Assessment & Plan:  Depression - PHq-9 score of 20 today.  Increase cymbalta dose to 60mg . followup in 4-6 weeks. So far she is tolerating it well and has not had any side effects. I'm hoping that she will continue to notice some significant improvement on the higher dose. I did give HER 2 weeks' worth of samples. Unfortunately we do not have any coupon card.  Tob Abuse - Will increase dose to continuigng pack,  one milligram twice a day. Will send new rx.  Given coupon card.  Flu- she is getting better. Using otc cough meds. Call if not continuing to get better over the next 7-10 days.

## 2011-06-26 ENCOUNTER — Other Ambulatory Visit: Payer: Self-pay | Admitting: *Deleted

## 2011-06-26 MED ORDER — GABAPENTIN 100 MG PO CAPS: 100.0000 mg | ORAL_CAPSULE | Freq: Every day | ORAL | Status: DC

## 2011-07-30 ENCOUNTER — Ambulatory Visit (INDEPENDENT_AMBULATORY_CARE_PROVIDER_SITE_OTHER): Payer: 59 | Admitting: Family Medicine

## 2011-07-30 ENCOUNTER — Telehealth: Payer: Self-pay | Admitting: Family Medicine

## 2011-07-30 ENCOUNTER — Encounter: Payer: Self-pay | Admitting: Family Medicine

## 2011-07-30 DIAGNOSIS — IMO0001 Reserved for inherently not codable concepts without codable children: Secondary | ICD-10-CM

## 2011-07-30 DIAGNOSIS — F32A Depression, unspecified: Secondary | ICD-10-CM

## 2011-07-30 DIAGNOSIS — F329 Major depressive disorder, single episode, unspecified: Secondary | ICD-10-CM

## 2011-07-30 DIAGNOSIS — Z1231 Encounter for screening mammogram for malignant neoplasm of breast: Secondary | ICD-10-CM

## 2011-07-30 DIAGNOSIS — R030 Elevated blood-pressure reading, without diagnosis of hypertension: Secondary | ICD-10-CM

## 2011-07-30 NOTE — Patient Instructions (Signed)

## 2011-07-30 NOTE — Progress Notes (Signed)
  Subjective:    Patient ID: Heather Dennis, female    DOB: Feb 04, 1966, 46 y.o.   MRN: [id number]  HPI Depression -We inc her cymbalta t0 60mg  last time. Was in [place] for 3 weeks visiting her daughter and grandkids.  She is worried about her husband. Thinks he is withdrawing and she is worried about this.  This has been really stressful for her.    Told ot f/u for BP as well. It was high at her last 2 pain appts.  She has gained weight since her knee injury to mostly likley contributing. Admits ate a lot of [place] food while out of town and can really work on her diet.   She is still seeing pain management. Havin numbness in boht hands.   Has had some swelling in the right hand. Review of Systems     Objective:   Physical Exam  Psychiatric: She has a normal mood and affect. Her behavior is normal.       Tearful on exam.           Assessment & Plan:  Depression - PHQ- 9 score of 4 today.  She is doing better on the higher dose.  Discussed counseling. I really think this could be very helpful for her to have an outlet. Right now she feels she cannot confide in her husband and she feels she doesn't want to burden her duaghter who has 2 kids.  F/U in 6 weeks. Please call back if would like to do counseling.   Elevated BP- it is borderline today but not 140 or higher. Discussed working on low salt diet.

## 2011-07-30 NOTE — Telephone Encounter (Signed)
LM for pt to returncall

## 2011-07-30 NOTE — Telephone Encounter (Signed)
When was her last mammo?  I think she might be due. If so let me know and I will order.

## 2011-07-31 ENCOUNTER — Ambulatory Visit: Payer: 59 | Admitting: Family Medicine

## 2011-08-12 ENCOUNTER — Other Ambulatory Visit: Payer: Self-pay | Admitting: *Deleted

## 2011-08-12 MED ORDER — DULOXETINE HCL 60 MG PO CPEP: 60.0000 mg | ORAL_CAPSULE | Freq: Every day | ORAL | Status: DC

## 2011-08-14 ENCOUNTER — Other Ambulatory Visit: Payer: Self-pay | Admitting: *Deleted

## 2011-08-19 MED ORDER — VENLAFAXINE HCL ER 37.5 MG PO CP24: ORAL_CAPSULE | ORAL | Status: DC

## 2011-08-19 NOTE — Telephone Encounter (Signed)
I will send the med but she will have to really try it or it is medicare fraud.

## 2011-08-19 NOTE — Telephone Encounter (Signed)
Pt informed and states ok.

## 2011-08-19 NOTE — Telephone Encounter (Signed)
Pt states that you can send a prescription and she will pick it up but she will not take it. States that her husband has some Cymbalta left that they took him off of and she will take that. Says that when 30 days is up she will call you and let you know that it doesn't work. States that the Cymbalta makes her feel better and that her legs don't hurt on it. States that she doesn't want to change something that is working.

## 2011-08-19 NOTE — Telephone Encounter (Signed)
Pt states she would like for you to schedule her for the mammogram. Pt also states that she is having trouble getting her Cymbalta. States she was going to call her insurance. I told her to let us know if they need any information for her to get it paid for.

## 2011-08-19 NOTE — Telephone Encounter (Signed)
Will put in mammo. They cont cover her cymblata. We already tried to prior [place] it.  Sue [facility] last week that she has to try an SNRI for at least 30 days and then if doesn't do well they will cover the cymbalta.

## 2011-08-22 ENCOUNTER — Telehealth: Payer: Self-pay | Admitting: *Deleted

## 2011-08-22 DIAGNOSIS — Z139 Encounter for screening, unspecified: Secondary | ICD-10-CM

## 2011-08-22 NOTE — Telephone Encounter (Signed)
GI imaging need order for mammogram screening with implants due to pt's implants.

## 2011-09-03 ENCOUNTER — Inpatient Hospital Stay: Admission: RE | Admit: 2011-09-03 | Payer: 59 | Source: Ambulatory Visit

## 2011-09-04 ENCOUNTER — Telehealth: Payer: Self-pay | Admitting: *Deleted

## 2011-09-04 NOTE — Telephone Encounter (Signed)
PA was denied in feb for cymbalta.Pt has to try a SNRI. Sent rx for Effexor. Now a letter for appeal has to be sent to [employer] stating why its med necessary for pt to be on this medication and that she tried and failed effexor. Fax number [phone]

## 2011-10-17 ENCOUNTER — Other Ambulatory Visit: Payer: Self-pay | Admitting: Family Medicine

## 2012-05-21 ENCOUNTER — Other Ambulatory Visit: Payer: Self-pay

## 2012-06-16 ENCOUNTER — Other Ambulatory Visit (HOSPITAL_BASED_OUTPATIENT_CLINIC_OR_DEPARTMENT_OTHER): Payer: Self-pay | Admitting: Psychiatry

## 2012-06-16 ENCOUNTER — Ambulatory Visit (HOSPITAL_BASED_OUTPATIENT_CLINIC_OR_DEPARTMENT_OTHER)
Admission: RE | Admit: 2012-06-16 | Discharge: 2012-06-16 | Disposition: A | Discharge status: Home / Self Care | Payer: 59 | Source: Ambulatory Visit | Attending: Psychiatry | Admitting: Psychiatry

## 2012-06-16 DIAGNOSIS — R52 Pain, unspecified: Secondary | ICD-10-CM

## 2012-06-16 DIAGNOSIS — M25559 Pain in unspecified hip: Secondary | ICD-10-CM | POA: Insufficient documentation

## 2012-10-29 ENCOUNTER — Telehealth: Payer: Self-pay | Admitting: *Deleted

## 2012-10-29 DIAGNOSIS — F4323 Adjustment disorder with mixed anxiety and depressed mood: Secondary | ICD-10-CM

## 2012-10-29 NOTE — Telephone Encounter (Signed)
Ok for psych referral See if she wants to see a counselor or acutal psychiatrist.

## 2012-10-29 NOTE — Telephone Encounter (Signed)
Referral placed.

## 2012-10-29 NOTE — Telephone Encounter (Signed)
Pt states a counselor would be fine

## 2012-10-29 NOTE — Telephone Encounter (Signed)
Pt calls and would like to get a referral to see psych

## 2012-12-24 ENCOUNTER — Telehealth: Payer: Self-pay | Admitting: *Deleted

## 2012-12-24 NOTE — Telephone Encounter (Signed)
Needs appt

## 2012-12-24 NOTE — Telephone Encounter (Signed)
Spoke with pt.  Advised her that an appt was needed & transferred her to scheduling.

## 2012-12-24 NOTE — Telephone Encounter (Signed)
Pt left a message on vm asking if you would send her an rx for premarin vaginal cream.  Would you like her to schedule an appt with you first? Please advise

## 2012-12-31 ENCOUNTER — Ambulatory Visit (INDEPENDENT_AMBULATORY_CARE_PROVIDER_SITE_OTHER): Payer: 59 | Admitting: Family Medicine

## 2012-12-31 ENCOUNTER — Encounter: Payer: Self-pay | Admitting: Family Medicine

## 2012-12-31 VITALS — BP 140/84 | HR 86 | Ht 65.0 in | Wt 193.0 lb

## 2012-12-31 DIAGNOSIS — Z0000 Encounter for general adult medical examination without abnormal findings: Secondary | ICD-10-CM

## 2012-12-31 DIAGNOSIS — N952 Postmenopausal atrophic vaginitis: Secondary | ICD-10-CM

## 2012-12-31 MED ORDER — ESTROGENS, CONJUGATED 0.625 MG/GM VA CREA: TOPICAL_CREAM | Freq: Every day | VAGINAL | Status: DC

## 2012-12-31 NOTE — Progress Notes (Signed)
Subjective:     Heather Dennis is a 47 y.o. female and is here for a comprehensive physical exam. The patient reports problems - says marriage is doing beter.  Marland Kitchen She was really on the edge divorced. She's been out of work for several years now. She still continues to have significant daily pain with her left knee. She is seen additional experts for surgery and they have recommended not doing surgery as I do not feel it would be helpful. She sees pain management and also on oxycodone and OxyContin. This is also been a source of contention between her and her husband. She feels like she's been the most functional she has been in the last 3 years. She still has daily pain but says that she has been much more creative and she is trying to do small crafty projects around the house. She has found this to be a very positive thing for her period and she and her husband recently had patched up the relationship and are doing well. In fact after 3 years they finally tried to have intercourse. She found it painful and uncomfortable. She did have her Pap smear done recently as well which was normal but had a lot of pain and discomfort with that as well. She wants to discuss treatment options. Her last period was in 2011. She's not had any vaginal pain or spotting or pelvic pain except for this pain during intercourse. She has no first degree relative with breast cancer and she has not any personal problems with abnormalities of her breasts. Her mammogram is up-to-date.  No chest pain, short of breath, blood in the urine or school peer, recent hearing or vision changes.  History   Social History  . Marital Status: Married    Spouse Name: N/A    Number of Children: N/A  . Years of Education: N/A   Occupational History  . Not on file.   Social History Main Topics  . Smoking status: Current Every Day Smoker -- 1.00 packs/day  . Smokeless tobacco: Not on file  . Alcohol Use: Yes  . Drug Use: No  . Sexually  Active: Not on file   Other Topics Concern  . Not on file   Social History Narrative  . No narrative on file   Health Maintenance  Topic Date Due  . Influenza Vaccine  02/15/2013  . Pap Smear  05/22/2015  . Tetanus/tdap  08/06/2020    The following portions of the patient's history were reviewed and updated as appropriate: allergies, current medications, past family history, past medical history, past social history, past surgical history and problem list.  Review of Systems A comprehensive review of systems was negative.   Objective:    BP 140/84  Pulse 86  Ht 5\' 5"  (1.651 m)  Wt 193 lb (87.544 kg)  BMI 32.12 kg/m2 General appearance: alert, cooperative and appears stated age Head: Normocephalic, without obvious abnormality, atraumatic Eyes: conj clear, EOMi, PEERLA Ears: normal TM's and external ear canals both ears Nose: Nares normal. Septum midline. Mucosa normal. No drainage or sinus tenderness. Throat: lips, mucosa, and tongue normal; teeth and gums normal Neck: no adenopathy, no carotid bruit, no JVD, supple, symmetrical, trachea midline and thyroid not enlarged, symmetric, no tenderness/mass/nodules Back: symmetric, no curvature. ROM normal. No CVA tenderness. Lungs: clear to auscultation bilaterally Breasts: not performed. Heart: regular rate and rhythm, S1, S2 normal, no murmur, click, rub or gallop Abdomen: soft, non-tender; bowel sounds normal; no masses,  no  organomegaly Pelvic:Not perfromed.  Extremities: extremities normal, atraumatic, no cyanosis or edema Pulses: 2+ and symmetric Skin: Skin color, texture, turgor normal. No rashes or lesions Lymph nodes: No cervical or supraclavicular lymph nodes. Neurologic: Alert and oriented X 3, normal strength and tone. Normal symmetric reflexes. Normal coordination and gait    Assessment:    Healthy female exam.      Plan:     See After Visit Summary for Counseling Recommendations  Keep up a regular exercise  program and make sure you are eating a healthy diet Try to eat 4 servings of dairy a day, or if you are lactose intolerant take a calcium with vitamin D daily.  Your vaccines are up to date.   Postmenopausal vaginitis-we discussed treatment options including Premarin cream versus one of the newer medication called Osphena.  Will start Premarin vaginal cream. She will need to make sure that she's getting her mammograms yearly. She's up-to-date on this. We discussed potential side effects of the medication. She's to use it daily for 2 weeks and then decrease to maintenance dosing of 2 or 3 times per week. Explained to her that a lot of women can even use it less than once weekly to maintain.

## 2013-03-05 ENCOUNTER — Ambulatory Visit: Payer: 59 | Admitting: Family Medicine

## 2013-03-05 DIAGNOSIS — Z0289 Encounter for other administrative examinations: Secondary | ICD-10-CM

## 2013-03-18 ENCOUNTER — Emergency Department (HOSPITAL_BASED_OUTPATIENT_CLINIC_OR_DEPARTMENT_OTHER)
Admission: EM | Admit: 2013-03-18 | Discharge: 2013-03-18 | Disposition: A | Discharge status: Home / Self Care | Payer: 59 | Attending: Emergency Medicine | Admitting: Emergency Medicine

## 2013-03-18 ENCOUNTER — Encounter (HOSPITAL_BASED_OUTPATIENT_CLINIC_OR_DEPARTMENT_OTHER): Payer: Self-pay | Admitting: *Deleted

## 2013-03-18 DIAGNOSIS — F17200 Nicotine dependence, unspecified, uncomplicated: Secondary | ICD-10-CM | POA: Insufficient documentation

## 2013-03-18 DIAGNOSIS — G8929 Other chronic pain: Secondary | ICD-10-CM

## 2013-03-18 DIAGNOSIS — G8921 Chronic pain due to trauma: Secondary | ICD-10-CM | POA: Insufficient documentation

## 2013-03-18 DIAGNOSIS — M25569 Pain in unspecified knee: Secondary | ICD-10-CM | POA: Insufficient documentation

## 2013-03-18 MED ORDER — OXYCODONE-ACETAMINOPHEN 10-325 MG PO TABS: 1.0000 | ORAL_TABLET | ORAL | Status: DC | PRN

## 2013-03-18 NOTE — ED Provider Notes (Signed)
CSN: [id number]     Arrival date & time 03/18/13  1301 History   First MD Initiated Contact with Patient 03/18/13 1306     Chief Complaint  Patient presents with  . Medication Refill   (Consider location/radiation/quality/duration/timing/severity/associated sxs/prior Treatment) HPI Comments: Pt states that she has chronic pain from a left knee injury and she has been in pain management for the last couple of years:pt states that she is out of her pain medication tonight and she was told by the clinic to come here as they can't see here today and they aren't open tomorrow:denies any new injury:pt states that she also had back and hip pain related to knee  The history is provided by the patient. No language interpreter was used.    Past Medical History  Diagnosis Date  . Smoker    Past Surgical History  Procedure Laterality Date  . Cosmetic surgery  1995 2002    Breast Aug  . Cesarean section  1981 1985  . Tubal ligation  1991  . Knee surgery      x 3   Family History  Problem Relation Age of Onset  . Hypertension Mother   . Alcohol abuse Father   . Hypertension Father   . Depression Sister   . Cancer Maternal Grandmother     ovarian    History  Substance Use Topics  . Smoking status: Current Every Day Smoker -- 1.00 packs/day  . Smokeless tobacco: Not on file  . Alcohol Use: Yes   OB History   Grav Para Term Preterm Abortions TAB SAB Ect Mult Living                 Review of Systems  Constitutional: Negative.   Respiratory: Negative.   Cardiovascular: Negative.     Allergies  Codeine  Home Medications   Current Outpatient Rx  Name  Route  Sig  Dispense  Refill  . clonazePAM (KLONOPIN) 0.5 MG tablet   Oral   Take 0.5 mg by mouth 2 (two) times daily as needed.           . conjugated estrogens (PREMARIN) vaginal cream   Vaginal   Place vaginally daily. 0.5 g PV x 2 weeks, then taper to 2-3 times a week for maintenance   42.5 g   2   . oxyCODONE  (ROXICODONE) 15 MG immediate release tablet   Oral   Take 15 mg by mouth every 4 (four) hours as needed for pain.         . OxyCODONE HCl ER (OXYCONTIN) 30 MG T12A   Oral   Take 1 tablet by mouth 3 (three) times daily.          BP 129/77  Pulse 118  Temp(Src) 98.6 F (37 C) (Oral)  Resp 16  Wt 193 lb (87.544 kg)  BMI 32.12 kg/m2  SpO2 98% Physical Exam  Nursing note and vitals reviewed. Constitutional: She is oriented to person, place, and time. She appears well-developed and well-nourished.  Cardiovascular: Normal rate and regular rhythm.   Pulmonary/Chest: Effort normal and breath sounds normal.  Musculoskeletal:  Decreased rom of the left knee:pt ambulates without assistance  Neurological: She is alert and oriented to person, place, and time.  Skin: Skin is warm and dry.  Psychiatric:  tearful    ED Course  Procedures (including critical care time) Labs Review Labs Reviewed - No data to display Imaging Review No results found.  MDM   1. Chronic  pain    Discussed with pt that she may loose her contract with the pain clinic if I give her a script and she verbalized understanding    Teressa Lower, NP 03/18/13 1326  Medical screening examination/treatment/procedure(s) were conducted as a shared visit with non-physician practitioner(s) or resident  and myself.  I personally evaluated the patient during the encounter and agree with the findings and plan unless otherwise indicated.    Chronic pain.  Mild tender anterior left knee, no swelling/ warmth or induration, full rom. No new injuries.  Fup with pcp/ pain clinic.  DC  Enid Skeens, MD 03/22/13 [phone]

## 2013-03-18 NOTE — ED Notes (Signed)
Pt requesting oxycontin refill she missed appt at pain management yesterday

## 2014-07-19 ENCOUNTER — Encounter: Payer: Self-pay | Admitting: Family Medicine

## 2014-07-19 ENCOUNTER — Telehealth: Payer: Self-pay | Admitting: Family Medicine

## 2014-07-19 ENCOUNTER — Ambulatory Visit (INDEPENDENT_AMBULATORY_CARE_PROVIDER_SITE_OTHER): Payer: 59 | Admitting: Family Medicine

## 2014-07-19 VITALS — BP 134/86 | HR 87 | Ht 65.0 in | Wt 161.0 lb

## 2014-07-19 DIAGNOSIS — G90529 Complex regional pain syndrome I of unspecified lower limb: Secondary | ICD-10-CM | POA: Insufficient documentation

## 2014-07-19 DIAGNOSIS — R209 Unspecified disturbances of skin sensation: Secondary | ICD-10-CM

## 2014-07-19 DIAGNOSIS — M5136 Other intervertebral disc degeneration, lumbar region: Secondary | ICD-10-CM | POA: Insufficient documentation

## 2014-07-19 DIAGNOSIS — G5600 Carpal tunnel syndrome, unspecified upper limb: Secondary | ICD-10-CM

## 2014-07-19 DIAGNOSIS — R0989 Other specified symptoms and signs involving the circulatory and respiratory systems: Secondary | ICD-10-CM

## 2014-07-19 DIAGNOSIS — G5601 Carpal tunnel syndrome, right upper limb: Secondary | ICD-10-CM

## 2014-07-19 DIAGNOSIS — Z0000 Encounter for general adult medical examination without abnormal findings: Secondary | ICD-10-CM

## 2014-07-19 DIAGNOSIS — G90522 Complex regional pain syndrome I of left lower limb: Secondary | ICD-10-CM

## 2014-07-19 HISTORY — DX: Carpal tunnel syndrome, unspecified upper limb: G56.00

## 2014-07-19 HISTORY — DX: Complex regional pain syndrome i of unspecified lower limb: G90.529

## 2014-07-19 NOTE — Addendum Note (Signed)
Addended by: Nani GasserMETHENEY, Spruha Weight D on: 07/19/2014 09:12 PM   Modules accepted: Orders, Level of Service

## 2014-07-19 NOTE — Telephone Encounter (Signed)
I ordered ABIs. I will just have to print in AM and [facility] since won't show up in her work que

## 2014-07-19 NOTE — Telephone Encounter (Signed)
Please call pat and see where had last mammogram? I looked in MyChart and couldn't find it.

## 2014-07-19 NOTE — Progress Notes (Addendum)
Subjective:     Heather Dennis is a 49 y.o. female and is here for a comprehensive physical exam. The patient reports problems - She would like a referral to new pain management. She's been going to her current pain management for the last 4 years and feels like she is scrutinized fair. She says she's been extremely consistent for the last 4 years but had to call and cancel one appointment because her husband had an emergency. She says they could not reschedule her for 2 weeks even though it was not a missed appointment she just had to call and reschedule.. She felt like this was unreasonable and not there since she had made all of her previous appointments and without her medication would experience withdrawals. They were finally able to work her in after she called and complained and was seen on Monday. She says the initial pain management specialist that she had she felt was very sympathetic but her current one is not. She's also interested in a practice in [place] that offer some counseling and therapy as well. 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 She has seen several specialists in hopes that they would repeat surgery and they have not. She feels like it has dramatically affected her life but feels like overall she is dealing with it very well. Her sister-in-law is here with her for support. She says she very rarely leaves the house. She is unable to flex the left knee at all.    History   Social History  . Marital Status: Married    Spouse Name: N/A    Number of Children: N/A  . Years of Education: N/A   Occupational History  . Not on file.   Social History Main Topics  . Smoking status: Current Every Day Smoker -- 1.00 packs/day  . Smokeless tobacco: Not on file  . Alcohol Use: Yes  . Drug Use: No  . Sexual Activity: Not on file   Other Topics Concern  . Not on file   Social History Narrative   Health Maintenance  Topic Date  Due  . HIV Screening  05/24/1979  . MAMMOGRAM  05/23/2014  . COLONOSCOPY  05/23/2014  . INFLUENZA VACCINE  07/20/2015 (Originally 01/15/2014)  . PAP SMEAR  05/22/2015  . TETANUS/TDAP  08/06/2020    The following portions of the patient's history were reviewed and updated as appropriate: allergies, current medications, past family history, past medical history, past social history, past surgical history and problem list.  Review of Systems A comprehensive review of systems was negative.   Objective:    BP 134/86 mmHg  Pulse 87  Ht  (1.651 m)  Wt 161 lb (73.029 kg)  BMI 26.79 kg/m2 General appearance: alert, cooperative and appears stated age Head: Normocephalic, without obvious abnormality, atraumatic Eyes: conj clear, EOMI, PEERLA Ears: normal TM's and external ear canals both ears Nose: Nares normal. Septum midline. Mucosa normal. No drainage or sinus tenderness. Throat: lips, mucosa, and tongue normal; teeth and gums normal Neck: no adenopathy, no carotid bruit, no JVD, supple, symmetrical, trachea midline and thyroid not enlarged, symmetric, no tenderness/mass/nodules Back: symmetric, no curvature. ROM normal. No CVA tenderness. Lungs: clear to auscultation bilaterally Heart: regular rate and rhythm, S1, S2 normal, no murmur, click, rub or gallop Abdomen: normal findings:   Extremities: extremities normal, atraumatic, no cyanosis or edema Pulses: 2+ and symmetric Skin: left leg is mottled. both feet are cold to touch.  unable to palpate dorsal pedal pulse Lymph nodes: Cervical adenopathy: nl Neurologic: Alert and oriented X 3, normal strength and tone. Normal symmetric reflexes. Normal coordination and gait    Assessment:    Healthy female exam.      Plan:     See After Visit Summary for Counseling Recommendations  Keep up a regular exercise program and make sure you are eating a healthy diet Try to eat 4 servings of dairy a day, or if you are lactose intolerant  take a calcium with vitamin D daily.  Your vaccines are up to date.   Declined pap smear today.  Says her mammogram is UTD.  Will try to call to get report.   Left leg is mottled, with absent DP pulse and cold foot. THis is not acute so I am not worried about DVT but I do think we should check ABIs.   CRPS - Will refer to new pain management.

## 2014-07-19 NOTE — Patient Instructions (Addendum)
Make sure you are eating a healthy diet Try to eat 4 servings of dairy a day, or if you are lactose intolerant take a calcium with vitamin D daily.  Your vaccines are up to date.

## 2014-07-19 NOTE — Telephone Encounter (Signed)
Pt stated that this was done at [facility] [phone]. She also asked about having a venous doppler done?

## 2014-07-20 NOTE — Telephone Encounter (Signed)
Order given to [place].[name], Imraan Wendell TylerLynetta

## 2014-07-21 ENCOUNTER — Telehealth: Payer: Self-pay | Admitting: Family Medicine

## 2014-07-21 ENCOUNTER — Other Ambulatory Visit (HOSPITAL_COMMUNITY): Payer: Self-pay | Admitting: Family Medicine

## 2014-07-21 DIAGNOSIS — R0989 Other specified symptoms and signs involving the circulatory and respiratory systems: Secondary | ICD-10-CM

## 2014-07-21 NOTE — Telephone Encounter (Signed)
Jayme CloudCalled Cindy [phone] at [facility] and scheduled patient for Monday 07/25/14 [facility] and I called patient and gave her all this appt info. [place]ax[place] to [place] at [phone]

## 2014-07-25 ENCOUNTER — Ambulatory Visit (HOSPITAL_COMMUNITY)
Admission: RE | Admit: 2014-07-25 | Discharge: 2014-07-25 | Disposition: A | Discharge status: Home / Self Care | Payer: 59 | Source: Ambulatory Visit | Attending: Family Medicine | Admitting: Family Medicine

## 2014-07-25 DIAGNOSIS — R0989 Other specified symptoms and signs involving the circulatory and respiratory systems: Secondary | ICD-10-CM

## 2014-07-25 NOTE — Progress Notes (Addendum)
VASCULAR LAB PRELIMINARY  ARTERIAL  ABI completed:    RIGHT    LEFT    PRESSURE WAVEFORM  PRESSURE WAVEFORM  BRACHIAL 145 TRIPHASIC BRACHIAL 160 TRIPHASIC  DP 181 TRIPHASIC DP 157 TRIPHASIC  PT 171 TRIPHASIC PT 176 TRIPHASIC    RIGHT LEFT  ABI 1.13 1.01     Redmond Pullingltzroth, Myla Mauriello F, RVT 07/25/2014, 4:06 PM   Preliminary report: ABI's essentially within normal limiits.   Triphasic waveforms noted throughtout.  Redmond Pullingltzroth, Chamika Cunanan F, RVT 07/25/2014, 12:38 PM

## 2014-08-01 ENCOUNTER — Telehealth: Payer: Self-pay | Admitting: Family Medicine

## 2014-08-01 NOTE — Telephone Encounter (Signed)
Please call patient her know that I get get the forms for the motorized scooter. She will need to make an appointment so that we can do an evaluation and document her knee. I will say it may be somewhat challenging to get this covered since she does have normal upper arm strength. That will usually qualify for her a manual wheelchair that might not qualify her for a electric scooter.

## 2014-08-04 NOTE — Telephone Encounter (Signed)
Pt advised to schedule a f/u she would also like the results of her ABI's.[name], Deshundra Waller [place]

## 2014-08-04 NOTE — Telephone Encounter (Signed)
I haven't received the results.  Lets call vascular lab and see if they can fax them over.

## 2014-08-09 LAB — TSH: TSH: 2.018 u[IU]/mL (ref 0.350–4.500)

## 2014-08-09 LAB — CBC WITH DIFFERENTIAL/PLATELET
BASOS ABS: 0 10*3/uL (ref 0.0–0.1)
Basophils Relative: 0 % (ref 0–1)
EOS ABS: 0.2 10*3/uL (ref 0.0–0.7)
Eosinophils Relative: 2 % (ref 0–5)
HCT: 44 % (ref 36.0–46.0)
Hemoglobin: 14.7 g/dL (ref 12.0–15.0)
Lymphocytes Relative: 32 % (ref 12–46)
Lymphs Abs: 3.2 10*3/uL (ref 0.7–4.0)
MCH: 30.3 pg (ref 26.0–34.0)
MCHC: 33.4 g/dL (ref 30.0–36.0)
MCV: 90.7 fL (ref 78.0–100.0)
MONOS PCT: 7 % (ref 3–12)
MPV: 10 fL (ref 8.6–12.4)
Monocytes Absolute: 0.7 10*3/uL (ref 0.1–1.0)
Neutro Abs: 5.8 10*3/uL (ref 1.7–7.7)
Neutrophils Relative %: 59 % (ref 43–77)
Platelets: 326 10*3/uL (ref 150–400)
RBC: 4.85 MIL/uL (ref 3.87–5.11)
RDW: 14.2 % (ref 11.5–15.5)
WBC: 9.9 10*3/uL (ref 4.0–10.5)

## 2014-08-09 LAB — COMPLETE METABOLIC PANEL WITH GFR
ALBUMIN: 4.2 g/dL (ref 3.5–5.2)
ALT: 19 U/L (ref 0–35)
AST: 22 U/L (ref 0–37)
Alkaline Phosphatase: 90 U/L (ref 39–117)
BUN: 11 mg/dL (ref 6–23)
CALCIUM: 9.5 mg/dL (ref 8.4–10.5)
CHLORIDE: 104 meq/L (ref 96–112)
CO2: 28 meq/L (ref 19–32)
CREATININE: 0.82 mg/dL (ref 0.50–1.10)
GFR, EST NON AFRICAN AMERICAN: 84 mL/min
GLUCOSE: 93 mg/dL (ref 70–99)
POTASSIUM: 4.3 meq/L (ref 3.5–5.3)
Sodium: 141 mEq/L (ref 135–145)
Total Bilirubin: 0.4 mg/dL (ref 0.2–1.2)
Total Protein: 6.5 g/dL (ref 6.0–8.3)

## 2014-08-09 LAB — LIPID PANEL
CHOL/HDL RATIO: 3.3 ratio
CHOLESTEROL: 201 mg/dL — AB (ref 0–200)
HDL: 61 mg/dL (ref >=46)
LDL Cholesterol: 118 mg/dL — ABNORMAL HIGH (ref 0–99)
Triglycerides: 111 mg/dL (ref <150)
VLDL: 22 mg/dL (ref 0–40)

## 2014-08-09 LAB — VITAMIN D 25 HYDROXY (VIT D DEFICIENCY, FRACTURES): Vit D, 25-Hydroxy: 26 ng/mL — ABNORMAL LOW (ref 30–100)

## 2014-08-09 LAB — SEDIMENTATION RATE: Sed Rate: 1 mm/hr (ref 0–22)

## 2014-08-15 ENCOUNTER — Ambulatory Visit (INDEPENDENT_AMBULATORY_CARE_PROVIDER_SITE_OTHER): Payer: 59 | Admitting: Family Medicine

## 2014-08-15 ENCOUNTER — Encounter: Payer: Self-pay | Admitting: Family Medicine

## 2014-08-15 VITALS — BP 138/73 | HR 88 | Wt 164.0 lb

## 2014-08-15 DIAGNOSIS — M79605 Pain in left leg: Secondary | ICD-10-CM

## 2014-08-15 DIAGNOSIS — G90522 Complex regional pain syndrome I of left lower limb: Secondary | ICD-10-CM

## 2014-08-15 DIAGNOSIS — R2681 Unsteadiness on feet: Secondary | ICD-10-CM

## 2014-08-15 NOTE — Progress Notes (Signed)
Subjective:    Patient ID: Heather LimLinda K Delsol, female    DOB: 05/09/1966, 49 y.o.   MRN: [id number]  HPI  49 year old female comes in today for evaluation for a motorized scooter. 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  Her initial injury began as she fell over a dog gate and cracked her patella on her left leg. She had surgery to repair the patella on 08/25/09.  Then repeat surgery x 2 within 14 week after the original surgery unfortunately she started to experience disability fairly quickly.. Says since then cannot flex the left knee at all. She now has atrophy of the left hamstring and quad as well as the calf muscles. She also has decreased range of motion of the left ankle. Unfortunately she spends most of the day lying in bed because very difficult for her to her to move around. She has balance issues because she can only put weight on her right leg. That she's been unable to use a walker or cane.  She usually drags leg when she attempts to walk.  She is no longer able to drive.  Several of her ADLs are limited. Limitations include preparing meals. Walking to bathroom.  Can usually get dressed but usually wear gowns to get dresses.  Dec upper extremities strength  In her wrist adn elbows so cannot propel a wheelchair.  Has severe right carpal tunnel and moderate in the left. This makes it difficult for her to even pressure self up out of bed. In fact she has to use her elbows and shoulders to do this. This would make it very difficult for her chipper Prell and will chair. She would not have the strength and endurance to do so. She also gets a lot of pain in her neck and upper back but does try to get massages to help with this.   tReview of Systems     Objective:   Physical Exam  Constitutional: She is oriented to person, place, and time. She appears well-developed and well-nourished.  HENT:  Head: Normocephalic and atraumatic.  Musculoskeletal:  Neck with normal  flexion, decreased extension. She had some pain over the lower cervical spine with range of motion stretches. She had decreased rotation right and left but was symmetric. Normal side bending left and right but did feel some tension in the strength in the mastoid area on the opposite side with cervical flexion. Shoulders with extension to about 145 before she started to experience pain. She was able to tap the top of her shoulder and reaching across to the opposite side.  Shoulder strength was 5 out of 5 bilaterally. Right elbow strength was 5 out of 5 with flexion and 4 out of 5 with extension on the right. Elbow strength was 5 out of 5 on the left. Wrist strength was 4/5 with extension and 5/5 with flexion.  She had some mild weakness in her middle finger on the right hand compared to the left. Grips was fair but caused pain.  Palpable knots in the trapezius on the right between spine and shoulder blade.  Right hip with 5 out of 5 flexion and extension. Right knee with 4 out of 5 flexion and 5 out of 5 extension against resistance. Right ankle with 5 out of 5 flexion and extension. Left hip with 4 out of 5 flexion. Left knee with 0 flexion at all. Strength of the left knee is 0 out of 5.  Left  knee in full extension all of the time. Significant atrophy of the hamstring and the left thigh with atrophy of the calf muscle of the left lower leg as well. Ankle flexion 4 out of 5 and ankle dorsiflexion 3 out of 5.  Neurological: She is alert and oriented to person, place, and time.  Skin: Skin is warm and dry.  Psychiatric: She has a normal mood and affect. Her behavior is normal.    She does have normal truncal stability.      Assessment & Plan:  I do think Ms. Celli is a candidate for a motorized scooter. I think it would significantly improve her quality of life and mobility to complete her ADLs such as preparing meals and getting to the bathroom and shower. She is currently unable to use a cane or walker.  She cannot use a manual wheelchair because of severe bilateral carpal tunnel syndrome. She does have an open floor plan that would allow the use of a motorized scooter and she is willing and able to use the device. She also has the mental capability to safely operate the vehicle. She would need additional custoomization to have her left leg elevated in full extension at all times on the device.  Time spent 45 minutes, greater than 50% time spent counseling about her comfort regional pain syndrome/left leg pain.

## 2014-09-01 ENCOUNTER — Telehealth: Payer: Self-pay | Admitting: *Deleted

## 2014-09-01 NOTE — Telephone Encounter (Signed)
Pt called and informed that information has been sent. She requested if this could be emailed to her. I told her that what's requested is an Rx and OV notes.[name], Bernardina Cacho Renf[place]Lynetta

## 2014-09-01 NOTE — Telephone Encounter (Signed)
[facility]ooter.confirmation page received.[name], Haidan Nhan [place]

## 2014-09-28 ENCOUNTER — Telehealth: Payer: Self-pay | Admitting: *Deleted

## 2014-09-28 NOTE — Telephone Encounter (Signed)
Form for scooter completed,faxed, copied, confirmation received. Spoke w/Devynne @ [facility] to inform that this has been done, she confirmed that fax was received.[name], Manual Navarra JeffersonLyn[place]

## 2014-09-29 ENCOUNTER — Telehealth: Payer: Self-pay | Admitting: *Deleted

## 2014-09-29 NOTE — Telephone Encounter (Signed)
Forms re-faxed confirmation page received.[name], Wilhelmena Zea [place]Lyn[place]

## 2014-09-29 NOTE — Telephone Encounter (Signed)
malissa lvm informing me that she had not received the form that she had faxed over for Dr. Linford ArnoldMetheney to complete and fax back. I called her back and lvm informing her that I had spoken w/Deborra at her office @ 358pm yesterday when I faxed the form to ensure that the form was received she stated that the form was coming thru the fax as we were speaking. I told her that I would fax the form again and if there were any problems to call back.[name], Demitrios Molyneux [place]

## 2014-10-12 ENCOUNTER — Emergency Department (HOSPITAL_BASED_OUTPATIENT_CLINIC_OR_DEPARTMENT_OTHER): Payer: 59

## 2014-10-12 ENCOUNTER — Emergency Department (HOSPITAL_BASED_OUTPATIENT_CLINIC_OR_DEPARTMENT_OTHER)
Admission: EM | Admit: 2014-10-12 | Discharge: 2014-10-12 | Disposition: A | Discharge status: Home / Self Care | Payer: 59 | Attending: Emergency Medicine | Admitting: Emergency Medicine

## 2014-10-12 ENCOUNTER — Encounter (HOSPITAL_BASED_OUTPATIENT_CLINIC_OR_DEPARTMENT_OTHER): Payer: Self-pay

## 2014-10-12 DIAGNOSIS — Z79899 Other long term (current) drug therapy: Secondary | ICD-10-CM | POA: Diagnosis not present

## 2014-10-12 DIAGNOSIS — G8929 Other chronic pain: Secondary | ICD-10-CM | POA: Insufficient documentation

## 2014-10-12 DIAGNOSIS — Z87891 Personal history of nicotine dependence: Secondary | ICD-10-CM | POA: Diagnosis not present

## 2014-10-12 DIAGNOSIS — M79605 Pain in left leg: Secondary | ICD-10-CM | POA: Diagnosis present

## 2014-10-12 DIAGNOSIS — Z8781 Personal history of (healed) traumatic fracture: Secondary | ICD-10-CM | POA: Diagnosis not present

## 2014-10-12 DIAGNOSIS — M7662 Achilles tendinitis, left leg: Secondary | ICD-10-CM | POA: Insufficient documentation

## 2014-10-12 NOTE — Discharge Instructions (Signed)
Achilles Tendinitis Achilles tendinitis is inflammation of the tough, cord-like band that attaches the lower muscles of your leg to your heel (Achilles tendon). It is usually caused by overusing the tendon and joint involved.  CAUSES Achilles tendinitis can happen because of:  A sudden increase in exercise or activity (such as running).  Doing the same exercises or activities (such as jumping) over and over.  Not warming up calf muscles before exercising.  Exercising in shoes that are worn out or not made for exercise.  Having arthritis or a bone growth on the back of the heel bone. This can rub against the tendon and hurt the tendon. SIGNS AND SYMPTOMS The most common symptoms are:  Pain in the back of the leg, just above the heel. The pain usually gets worse with exercise and better with rest.  Stiffness or soreness in the back of the leg, especially in the morning.  Swelling of the skin over the Achilles tendon.  Trouble standing on tiptoe. Sometimes, an Achilles tendon tears (ruptures). Symptoms of an Achilles tendon rupture can include:  Sudden, severe pain in the back of the leg.  Trouble putting weight on the foot or walking normally. DIAGNOSIS Achilles tendinitis will be diagnosed based on symptoms and a physical examination. An X-ray may be done to check if another condition is causing your symptoms. An MRI may be ordered if your health care provider suspects you may have completely torn your tendon, which is called an Achilles tendon rupture.  TREATMENT  Achilles tendinitis usually gets better over time. It can take weeks to months to heal completely. Treatment focuses on treating the symptoms and helping the injury heal. HOME CARE INSTRUCTIONS   Rest your Achilles tendon and avoid activities that cause pain.  Apply ice to the injured area:  Put ice in a plastic bag.  Place a towel between your skin and the bag.  Leave the ice on for 20 minutes, 2-3 times a  day  Try to avoid using the tendon (other than gentle range of motion) while the tendon is painful. Do not resume use until instructed by your health care provider. Then begin use gradually. Do not increase use to the point of pain. If pain does develop, decrease use and continue the above measures. Gradually increase activities that do not cause discomfort until you achieve normal use.  Do exercises to make your calf muscles stronger and more flexible. Your health care provider or physical therapist can recommend exercises for you to do.  Wrap your ankle with an elastic bandage or other wrap. This can help keep your tendon from moving too much. Your health care provider will show you how to wrap your ankle correctly.  Only take over-the-counter or prescription medicines for pain, discomfort, or fever as directed by your health care provider. SEEK MEDICAL CARE IF:   Your pain and swelling increase or pain is uncontrolled with medicines.  You develop new, unexplained symptoms or your symptoms get worse.  You are unable to move your toes or foot.  You develop warmth and swelling in your foot.  You have an unexplained temperature. MAKE SURE YOU:   Understand these instructions.  Will watch your condition.  Will get help right away if you are not doing well or get worse. Document Released: 03/13/2005 Document Revised: 03/24/2013 Document Reviewed: 01/13/2013 Exit[employer] Patient Information 2015 [employer]. This information is not intended to replace advice given to you by your health care provider. Make sure you discuss   any questions you have with your health care provider.  

## 2014-10-12 NOTE — ED Notes (Signed)
Pt reports left achilles pain. Hx of patella fx with multiple surgeries.  Reports woke up two days ago with severe pain. No known injury.  Pt has left leg swollen with skin blotchy at thigh. Pt reports cant bare weight on left leg.

## 2014-10-12 NOTE — ED Provider Notes (Signed)
CSN: [id number]     Arrival date & time 10/12/14  1032 History   First MD Initiated Contact with Patient 10/12/14 1115     Chief Complaint  Patient presents with  . Leg Pain     (Consider location/radiation/quality/duration/timing/severity/associated sxs/prior Treatment) HPI 49 year old female presents with pain over her left Achilles tendon has been present for the last 2 days. Has a history of a patella fracture with chronic left knee/leg pain for the past several years. Is on chronic oxycodone. Patient uses her right leg more than her left to keep the pain away. However she noticed when she was using it the other day she has been expressing left Achilles pain. This is been progressively worsening. Notes some swelling to her ankle and complains of ankle pain. Does not remember specific injury but states she was outside for some time and thinks that maybe this caused her pain. No weakness or numbness. Cannot bear weight on her left leg.  Past Medical History  Diagnosis Date  . Smoker    Past Surgical History  Procedure Laterality Date  . Cosmetic surgery  1995 2002    Breast Aug  . Cesarean section  1981 1985  . Tubal ligation  1991  . Knee surgery  08/25/09    x 3   Family History  Problem Relation Age of Onset  . Hypertension Mother   . Alcohol abuse Father   . Hypertension Father   . Depression Sister   . Cancer Maternal Grandmother     ovarian    History  Substance Use Topics  . Smoking status: Former Smoker -- 1.00 packs/day  . Smokeless tobacco: Not on file  . Alcohol Use: 0.0 oz/week    0 Standard drinks or equivalent per week   OB History    No data available     Review of Systems  Musculoskeletal: Positive for joint swelling and arthralgias.  Skin: Negative for color change and wound.  Neurological: Negative for weakness and numbness.  All other systems reviewed and are negative.     Allergies  Codeine  Home Medications   Prior to Admission  medications   Medication Sig Start Date End Date Taking? Authorizing Provider  clonazePAM (KLONOPIN) 0.5 MG tablet Take 0.5 mg by mouth 2 (two) times daily as needed.      Historical Provider, MD  lidocaine (LIDODERM) 5 %  05/26/14   Historical Provider, MD  oxyCODONE (ROXICODONE) 15 MG immediate release tablet Take 15 mg by mouth every 4 (four) hours as needed for pain.    Historical Provider, MD  OxyCODONE HCl ER (OXYCONTIN) 30 MG T12A Take 1 tablet by mouth 3 (three) times daily.    Historical Provider, MD   BP 135/77 mmHg  Pulse 97  Temp(Src) 98.4 F (36.9 C) (Oral)  Resp 18  Ht  (1.651 m)  Wt 162 lb (73.483 kg)  BMI 26.96 kg/m2  SpO2 98% Physical Exam  Constitutional: She is oriented to person, place, and time. She appears well-developed and well-nourished.  HENT:  Head: Normocephalic and atraumatic.  Right Ear: External ear normal.  Left Ear: External ear normal.  Nose: Nose normal.  Eyes: Right eye exhibits no discharge. Left eye exhibits no discharge.  Cardiovascular: Normal rate and intact distal pulses.   Pulses:      Dorsalis pedis pulses are 2+ on the right side, and 2+ on the left side.  Pulmonary/Chest: Effort normal.  Abdominal: She exhibits no distension.  Musculoskeletal:  Left ankle: She exhibits swelling (mild). Tenderness. Achilles tendon exhibits pain. Achilles tendon exhibits no defect and normal Thompson's test results.  Neurological: She is alert and oriented to person, place, and time.  Skin: Skin is warm and dry.  Nursing note and vitals reviewed.   ED Course  Procedures (including critical care time) Labs Review Labs Reviewed - No data to display  Imaging Review Dg Ankle Complete Left  10/12/2014   CLINICAL DATA:  Two day history of pain and swelling. No recent trauma  EXAM: LEFT ANKLE COMPLETE - 3+ VIEW  COMPARISON:  None.  FINDINGS: Frontal, oblique, and lateral views obtained. There is no fracture or joint effusion. Ankle mortise  appears intact. No appreciable joint space narrowing.  IMPRESSION: No fracture.  Mortise intact.  No appreciable arthropathy.   Electronically Signed   By: Bretta BangWilliam  Woodruff III M.D.   On: 10/12/2014 12:14     EKG Interpretation None      MDM   Final diagnoses:  Left Achilles tendinitis    Patient has tenderness with mild swelling of her ankle joint. Tenderness over her Achilles without obvious deformity. Thompson test is intact and equal to other Achilles. Likely a tendinitis. Patient has not been on Cipro/other antibiotics or steroids recently. Discussed with Dr. August Saucerean of orthopedics recommends placing in a boot and follow-up in his office when available. She is currently on ibuprofen and has plenty of narcotics at home.    Pricilla LovelessScott Lilith Solana, MD 10/12/14 [phone]

## 2014-10-31 ENCOUNTER — Other Ambulatory Visit: Payer: Self-pay | Admitting: Family Medicine

## 2014-10-31 MED ORDER — VARENICLINE TARTRATE 1 MG PO TABS: 1.0000 mg | ORAL_TABLET | Freq: Two times a day (BID) | ORAL | Status: DC

## 2014-10-31 NOTE — Telephone Encounter (Signed)
Patient request Rx for Chantix 1mg . Please advise if refill is appropriate.

## 2015-01-12 ENCOUNTER — Ambulatory Visit (INDEPENDENT_AMBULATORY_CARE_PROVIDER_SITE_OTHER): Payer: 59 | Admitting: Family Medicine

## 2015-01-12 ENCOUNTER — Encounter: Payer: Self-pay | Admitting: Family Medicine

## 2015-01-12 VITALS — BP 140/81 | HR 98 | Wt 161.0 lb

## 2015-01-12 DIAGNOSIS — F411 Generalized anxiety disorder: Secondary | ICD-10-CM

## 2015-01-12 DIAGNOSIS — R21 Rash and other nonspecific skin eruption: Secondary | ICD-10-CM | POA: Diagnosis not present

## 2015-01-12 DIAGNOSIS — F430 Acute stress reaction: Secondary | ICD-10-CM | POA: Diagnosis not present

## 2015-01-12 MED ORDER — MUPIROCIN 2 % EX OINT: TOPICAL_OINTMENT | CUTANEOUS | Status: DC

## 2015-01-12 MED ORDER — CLONAZEPAM 0.5 MG PO TABS: 0.5000 mg | ORAL_TABLET | Freq: Two times a day (BID) | ORAL | Status: DC | PRN

## 2015-01-12 MED ORDER — ESCITALOPRAM OXALATE 10 MG PO TABS: 10.0000 mg | ORAL_TABLET | Freq: Every day | ORAL | Status: DC
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
## 2015-01-13 ENCOUNTER — Encounter: Payer: Self-pay | Admitting: Family Medicine

## 2015-01-26 ENCOUNTER — Encounter: Payer: Self-pay | Admitting: *Deleted

## 2015-01-26 ENCOUNTER — Telehealth: Payer: Self-pay | Admitting: *Deleted

## 2015-01-26 NOTE — Telephone Encounter (Signed)
Letter printed, and faxed.Loralee Pacas [place]

## 2015-01-26 NOTE — Telephone Encounter (Signed)
Pt called and stated that her sister is caring for her and Dr. Linford Arnold was going to write a letter for her sister [name] is caring for her during this time. She is asking that the letter be for 30 days. She would like for this to be faxed to [phone] ATTN: Roanna Epley 01/05/2015 she did that forms were sent from her sisters job @ [employer] with her name Heather Dennis). I will write letter for now. And ask about Dr. Linford Arnold and ask triage about forms.[name]

## 2015-02-08 ENCOUNTER — Other Ambulatory Visit: Payer: Self-pay | Admitting: Family Medicine

## 2015-02-10 ENCOUNTER — Ambulatory Visit: Payer: 59 | Admitting: Family Medicine

## 2015-03-16 ENCOUNTER — Other Ambulatory Visit: Payer: Self-pay | Admitting: Family Medicine

## 2015-06-23 ENCOUNTER — Telehealth: Payer: Self-pay | Admitting: Family Medicine

## 2015-06-23 DIAGNOSIS — G90522 Complex regional pain syndrome I of left lower limb: Secondary | ICD-10-CM

## 2015-06-23 NOTE — Telephone Encounter (Signed)
Referral placed.

## 2015-06-23 NOTE — Telephone Encounter (Signed)
Ok to place new referral 

## 2016-01-23 DIAGNOSIS — F1120 Opioid dependence, uncomplicated: Secondary | ICD-10-CM | POA: Insufficient documentation

## 2016-01-23 HISTORY — DX: Opioid dependence, uncomplicated: F11.20

## 2016-02-12 ENCOUNTER — Emergency Department (INDEPENDENT_AMBULATORY_CARE_PROVIDER_SITE_OTHER)
Admission: EM | Admit: 2016-02-12 | Discharge: 2016-02-12 | Disposition: A | Discharge status: Other Acute Inpatient Hospital | Payer: 59 | Source: Home / Self Care | Attending: Family Medicine | Admitting: Family Medicine

## 2016-02-12 ENCOUNTER — Encounter: Payer: Self-pay | Admitting: *Deleted

## 2016-02-12 DIAGNOSIS — F1193 Opioid use, unspecified with withdrawal: Secondary | ICD-10-CM

## 2016-02-12 DIAGNOSIS — R112 Nausea with vomiting, unspecified: Secondary | ICD-10-CM | POA: Diagnosis not present

## 2016-02-12 DIAGNOSIS — F1123 Opioid dependence with withdrawal: Secondary | ICD-10-CM

## 2016-02-12 DIAGNOSIS — R197 Diarrhea, unspecified: Secondary | ICD-10-CM

## 2016-02-12 MED ORDER — ONDANSETRON 4 MG PO TBDP
4.0000 mg | ORAL_TABLET | Freq: Once | ORAL | Status: AC
  Administered 2016-02-12: 4 mg via ORAL

## 2016-02-12 NOTE — ED Provider Notes (Signed)
Heather Dennis CARE    CSN: [id number] Arrival date & time: 02/12/16  4098  First Provider Contact:  First MD Initiated Contact with Patient 02/12/16 [phone]        History   Chief Complaint Chief Complaint  Patient presents with  . Emesis    HPI Heather Dennis is a 50 y.o. female.   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  During the past month she began pain management treatment at the [facility] facility under the care of Dr. Carlene Coria. [place].  She had been titrated to Subsolv 1.4-0.36 (buprenorphine/naloxine), 3 tabs/day.  However she stopped taking the medication 8 days ago.  Since then she has had persistent nausea/vomiting and diarrhea 3 to 4 stools per day during the past week.  She complains of constant abdominal bloating.  No chest pain or shortness of breath.   The history is provided by the patient.  Emesis  Severity:  Moderate Duration:  1 week Timing:  Intermittent Quality:  Stomach contents Progression:  Worsening Chronicity:  New Recent urination:  Decreased Relieved by:  Nothing Worsened by:  Liquids Ineffective treatments:  Antiemetics Associated symptoms: chills, diarrhea and fever   Associated symptoms: no abdominal pain, no arthralgias, no cough, no headaches, no myalgias, no sore throat and no URI   Risk factors: no travel to endemic areas     Past Medical History:  Diagnosis Date  . Smoker     Patient Active Problem List   Diagnosis Date Noted  . CRPS (complex regional pain syndrome), lower limb 07/19/2014  . Carpal tunnel syndrome 07/19/2014  . DDD (degenerative disc disease), lumbar 07/19/2014  . Postmenopausal atrophic vaginitis 12/31/2012  . OTHER ACUTE REACTIONS TO STRESS 01/23/2010  . MENOPAUSE-RELATED VASOMOTOR SYMPTOMS 01/21/2007  . DEPRESSION, MAJOR, RECURRENT 03/25/2006  . TOBACCO DEPENDENCE 03/25/2006    Past Surgical History:    Procedure Laterality Date  . CESAREAN SECTION  1981 1985  . COSMETIC SURGERY  1995 2002   Breast Aug  . KNEE SURGERY  08/25/09   x 3  . TUBAL LIGATION  1991    OB History    No data available       Home Medications    Prior to Admission medications   Medication Sig Start Date End Date Taking? Authorizing Provider  DULoxetine (CYMBALTA) 20 MG capsule Take 20 mg by mouth daily.   Yes Historical Provider, MD  lidocaine (LIDODERM) 5 %  05/26/14   Historical Provider, MD    Family History Family History  Problem Relation Age of Onset  . Hypertension Mother   . Alcohol abuse Father   . Hypertension Father   . Depression Sister   . Cancer Maternal Grandmother     ovarian     Social History Social History  Substance Use Topics  . Smoking status: Former Smoker    Packs/day: 1.00  . Smokeless tobacco: Not on file  . Alcohol use 0.0 oz/week     Allergies   Codeine   Review of Systems Review of Systems  Constitutional: Positive for activity change, appetite change, chills, fatigue and fever. Negative for diaphoresis.  HENT: Negative.  Negative for sore throat.   Eyes: Negative.   Respiratory: Negative for cough, chest tightness and shortness of breath.   Cardiovascular: Negative for chest pain.  Gastrointestinal: Positive for diarrhea, nausea and vomiting. Negative for abdominal distention and abdominal pain.  Genitourinary: Negative.  Musculoskeletal: Negative for arthralgias and myalgias.  Skin: Negative.   Neurological: Negative for headaches.     Physical Exam Triage Vital Signs ED Triage Vitals  Enc Vitals Group     BP 02/12/16 0916 145/95     Pulse Rate 02/12/16 0916 107     Resp 02/12/16 0916 18     Temp 02/12/16 0916 97.7 F (36.5 C)     Temp Source 02/12/16 0916 Oral     SpO2 02/12/16 0916 98 %     Weight 02/12/16 0917 150 lb (68 kg)     Height 02/12/16 0917 5\' 5"  (1.651 m)     Head Circumference --      Peak Flow --      Pain Score  02/12/16 0919 0     Pain Loc --      Pain Edu? --      Excl. in GC? --    No data found.   Updated Vital Signs BP 145/95 (BP Location: Left Arm)   Pulse 107   Temp 97.7 F (36.5 C) (Oral)   Resp 18   Ht 5\' 5"  (1.651 m)   Wt 150 lb (68 kg)   SpO2 98%   BMI 24.96 kg/m   Visual Acuity Right Eye Distance:   Left Eye Distance:   Bilateral Distance:    Right Eye Near:   Left Eye Near:    Bilateral Near:     Physical Exam  Constitutional: She is oriented to person, place, and time. She appears well-developed and well-nourished. No distress.  HENT:  Head: Normocephalic.  Nose: Nose normal.  Mouth/pharyn:  Decreased moisture  Eyes: Conjunctivae and EOM are normal. Pupils are equal, round, and reactive to light.  Neck: Neck supple.  Cardiovascular: Regular rhythm and normal heart sounds.   Rate 112  Pulmonary/Chest: Breath sounds normal.  Abdominal: Soft.  Bowel sounds increased  Musculoskeletal: She exhibits no edema.  Lymphadenopathy:    She has no cervical adenopathy.  Neurological: She is alert and oriented to person, place, and time.  Skin: Skin is warm and dry. She is not diaphoretic.  Psychiatric: Her mood appears anxious. Her speech is rapid and/or pressured. She is agitated. She is not combative. Cognition and memory are normal.  Nursing note and vitals reviewed.    UC Treatments / Results  Labs (all labs ordered are listed, but only abnormal results are displayed) Labs Reviewed - No data to display  EKG  EKG Interpretation None       Radiology No results found.  Procedures Procedures (including critical care time)  Medications Ordered in UC Medications  ondansetron (ZOFRAN-ODT) disintegrating tablet 4 mg (4 mg Oral Given 02/12/16 0909)     Initial Impression / Assessment and Plan / UC Course  I have reviewed the triage vital signs and the nursing notes.  Pertinent labs & imaging results that were available during my care of the patient  were reviewed by me and considered in my medical decision making (see chart for details).  Clinical Course   Administered Zofran ODT 4mg  po. Administered IV normal saline at [facility] Will transfer to [facility] Emergency Department for fluid/electrolyte management, control of nausea/vomiting, and management of opioid withdrawal symptoms.  Final Clinical Impressions(s) / UC Diagnoses   Final diagnoses:  Opioid withdrawal (HCC)  Nausea and vomiting, vomiting of unspecified type  Diarrhea, unspecified type    New Prescriptions New Prescriptions   No medications on file  Lattie HawStephen A Beese, MD 02/12/16 1000

## 2016-02-12 NOTE — ED Triage Notes (Signed)
Pt reports having nausea, vomiting and diarrhea x 8 days after stopping Oxycodone. She took Zofran 4mg  at home at 0600.

## 2016-02-15 ENCOUNTER — Telehealth: Payer: Self-pay | Admitting: Emergency Medicine

## 2016-02-15 NOTE — Telephone Encounter (Signed)
Hope she is feeling better, call with ny questions or concerns

## 2016-03-27 ENCOUNTER — Telehealth: Payer: Self-pay | Admitting: *Deleted

## 2016-03-27 NOTE — Telephone Encounter (Signed)
Care everywhere.Heather Dennis Heather Dennis  

## 2016-03-28 ENCOUNTER — Encounter: Payer: Self-pay | Admitting: Family Medicine

## 2016-03-28 ENCOUNTER — Ambulatory Visit (INDEPENDENT_AMBULATORY_CARE_PROVIDER_SITE_OTHER): Payer: 59 | Admitting: Family Medicine

## 2016-03-28 VITALS — BP 140/84 | HR 82 | Ht 65.0 in | Wt 160.0 lb

## 2016-03-28 DIAGNOSIS — R030 Elevated blood-pressure reading, without diagnosis of hypertension: Secondary | ICD-10-CM | POA: Diagnosis not present

## 2016-03-28 DIAGNOSIS — G4700 Insomnia, unspecified: Secondary | ICD-10-CM

## 2016-03-28 DIAGNOSIS — E559 Vitamin D deficiency, unspecified: Secondary | ICD-10-CM | POA: Diagnosis not present

## 2016-03-28 DIAGNOSIS — F411 Generalized anxiety disorder: Secondary | ICD-10-CM

## 2016-03-28 DIAGNOSIS — G5772 Causalgia of left lower limb: Secondary | ICD-10-CM

## 2016-03-28 NOTE — Patient Instructions (Addendum)
DASH Eating Plan  DASH stands for "Dietary Approaches to Stop Hypertension." The DASH eating plan is a healthy eating plan that has been shown to reduce high blood pressure (hypertension). Additional health benefits may include reducing the risk of type 2 diabetes mellitus, heart disease, and stroke. The DASH eating plan may also help with weight loss.  WHAT DO I NEED TO KNOW ABOUT THE DASH EATING PLAN?  For the DASH eating plan, you will follow these general guidelines:  · Choose foods with a percent daily value for sodium of less than 5% (as listed on the food label).  · Use salt-free seasonings or herbs instead of table salt or sea salt.  · Check with your health care provider or pharmacist before using salt substitutes.  · Eat lower-sodium products, often labeled as "lower sodium" or "no salt added."  · Eat fresh foods.  · Eat more vegetables, fruits, and low-fat dairy products.  · Choose whole grains. Look for the word "whole" as the first word in the ingredient list.  · Choose fish and skinless chicken or turkey more often than red meat. Limit fish, poultry, and meat to 6 oz (170 g) each day.  · Limit sweets, desserts, sugars, and sugary drinks.  · Choose heart-healthy fats.  · Limit cheese to 1 oz (28 g) per day.  · Eat more home-cooked food and less restaurant, buffet, and fast food.  · Limit fried foods.  · Cook foods using methods other than frying.  · Limit canned vegetables. If you do use them, rinse them well to decrease the sodium.  · When eating at a restaurant, ask that your food be prepared with less salt, or no salt if possible.  WHAT FOODS CAN I EAT?  Seek help from a dietitian for individual calorie needs.  Grains  Whole grain or whole wheat bread. Brown rice. Whole grain or whole wheat pasta. Quinoa, bulgur, and whole grain cereals. Low-sodium cereals. Corn or whole wheat flour tortillas. Whole grain cornbread. Whole grain crackers. Low-sodium crackers.  Vegetables  Fresh or frozen vegetables  (raw, steamed, roasted, or grilled). Low-sodium or reduced-sodium tomato and vegetable juices. Low-sodium or reduced-sodium tomato sauce and paste. Low-sodium or reduced-sodium canned vegetables.   Fruits  All fresh, canned (in natural juice), or frozen fruits.  Meat and Other Protein Products  Ground beef (85% or leaner), grass-fed beef, or beef trimmed of fat. Skinless chicken or turkey. Ground chicken or turkey. Pork trimmed of fat. All fish and seafood. Eggs. Dried beans, peas, or lentils. Unsalted nuts and seeds. Unsalted canned beans.  Dairy  Low-fat dairy products, such as skim or 1% milk, 2% or reduced-fat cheeses, low-fat ricotta or cottage cheese, or plain low-fat yogurt. Low-sodium or reduced-sodium cheeses.  Fats and Oils  Tub margarines without trans fats. Light or reduced-fat mayonnaise and salad dressings (reduced sodium). Avocado. Safflower, olive, or canola oils. Natural peanut or almond butter.  Other  Unsalted popcorn and pretzels.  The items listed above may not be a complete list of recommended foods or beverages. Contact your dietitian for more options.  WHAT FOODS ARE NOT RECOMMENDED?  Grains  White bread. White pasta. White rice. Refined cornbread. Bagels and croissants. Crackers that contain trans fat.  Vegetables  Creamed or fried vegetables. Vegetables in a cheese sauce. Regular canned vegetables. Regular canned tomato sauce and paste. Regular tomato and vegetable juices.  Fruits  Dried fruits. Canned fruit in light or heavy syrup. Fruit juice.  Meat and Other Protein   Products  Fatty cuts of meat. Ribs, chicken wings, bacon, sausage, bologna, salami, chitterlings, fatback, hot dogs, bratwurst, and packaged luncheon meats. Salted nuts and seeds. Canned beans with salt.  Dairy  Whole or 2% milk, cream, half-and-half, and cream cheese. Whole-fat or sweetened yogurt. Full-fat cheeses or blue cheese. Nondairy creamers and whipped toppings. Processed cheese, cheese spreads, or cheese  curds.  Condiments  Onion and garlic salt, seasoned salt, table salt, and sea salt. Canned and packaged gravies. Worcestershire sauce. Tartar sauce. Barbecue sauce. Teriyaki sauce. Soy sauce, including reduced sodium. Steak sauce. Fish sauce. Oyster sauce. Cocktail sauce. Horseradish. Ketchup and mustard. Meat flavorings and tenderizers. Bouillon cubes. Hot sauce. Tabasco sauce. Marinades. Taco seasonings. Relishes.  Fats and Oils  Butter, stick margarine, lard, shortening, ghee, and bacon fat. Coconut, palm kernel, or palm oils. Regular salad dressings.  Other  Pickles and olives. Salted popcorn and pretzels.  The items listed above may not be a complete list of foods and beverages to avoid. Contact your dietitian for more information.  WHERE CAN I FIND MORE INFORMATION?  National Heart, Lung, and Blood Institute: www.nhlbi.nih.gov/health/health-topics/topics/dash/     This information is not intended to replace advice given to you by your health care provider. Make sure you discuss any questions you have with your health care provider.     Document Released: 05/23/2011 Document Revised: 06/24/2014 Document Reviewed: 04/07/2013  Elsevier Interactive Patient Education ©2016 Elsevier Inc.

## 2016-03-28 NOTE — Progress Notes (Addendum)
Subjective:    CC: ED F/U   HPI:  Patient comes in today for recent hospital follow-up. After a knee injury that resulted in chronic injury and pain she developed a dependence on opioids. She had been working with [facility]inedu at [facility]  She decided to wean her medication on her own several weeks ago. And she actually presented to the emergency department on August 28th for opioid withdrawal symptoms including nausea, vomiting and diarrhea. She has been off opioids for about 7.5 weeks for now. She is involved in church. She has been going to some female support group sessions which have been very helpful.   Pression/anxiety-she decided to discontinue her Cymbalta which she was also using for chronic pain. She said she really had not noticed a difference on or off of it. She is not interested in taking any other medications at this point in time.  Past medical history, Surgical history, Family history not pertinant except as noted below, Social history, Allergies, and medications have been entered into the medical record, reviewed, and corrections made.   Review of Systems: No fevers, chills, night sweats, weight loss, chest pain, or shortness of breath.   Objective:    General: Well Developed, well nourished, and in no acute distress.  Neuro: Alert and oriented x3, extra-ocular muscles intact, sensation grossly intact.  HEENT: Normocephalic, atraumatic  Skin: Warm and dry, no rashes. Cardiac: Regular rate and rhythm, no murmurs rubs or gallops, no lower extremity edema.  Respiratory: Clear to auscultation bilaterally. Not using accessory muscles, speaking in full sentences.   Impression and Recommendations:   Elevated BP - will work on [employer] diet. Given info today. F/U in 8 weeks. Repeat BP was better.  We discussed the importance of healthy diet and regular exercise that she is able to do with her left leg disability. Check TSH.     CRPS - She has been off narcotic pain medication for 7 weeks at this point and so far is doing well. She does still occasionally use Tylenol PM but acutely at night and her Lidoderm patch. She does still have a few clonazepam left which she uses occasionally more so for sleep. We also discussed mindfullness. Also encouraged her to continue with her support group through her church.  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 I explained her that studies actually showed that there can be slowly sleep and mood issues up to a year after discontinuation of chronic opioids. Just encourage her to work on sleep hygiene.  Vit D- Due for vitamin D  Recheck.   GAD- continue with support group. Continue work on trying to get good quality sleep and healthy foods. Work on ways to do stress. Hopefully her divorce will be finalized soon and this should help. GAD 7 score of 16 and PHQ 9 score of 21. She discontinued her Cymbalta on her own.  Time spent 50 min, > 50% spent cousneling about BP, CRPS, chronic pain, insomnia and anxiety.

## 2016-04-03 LAB — TSH: TSH: 1.32 mIU/L

## 2016-04-03 LAB — VITAMIN D 25 HYDROXY (VIT D DEFICIENCY, FRACTURES): Vit D, 25-Hydroxy: 25 ng/mL — ABNORMAL LOW (ref 30–100)

## 2016-08-27 ENCOUNTER — Encounter: Payer: Self-pay | Admitting: Family Medicine

## 2016-08-27 ENCOUNTER — Ambulatory Visit (INDEPENDENT_AMBULATORY_CARE_PROVIDER_SITE_OTHER): Payer: Medicare HMO | Admitting: Family Medicine

## 2016-08-27 VITALS — BP 140/74 | HR 101 | Ht 65.0 in | Wt 167.0 lb

## 2016-08-27 DIAGNOSIS — F419 Anxiety disorder, unspecified: Secondary | ICD-10-CM

## 2016-08-27 DIAGNOSIS — G5772 Causalgia of left lower limb: Secondary | ICD-10-CM | POA: Diagnosis not present

## 2016-08-27 DIAGNOSIS — Z0000 Encounter for general adult medical examination without abnormal findings: Secondary | ICD-10-CM | POA: Diagnosis not present

## 2016-08-27 DIAGNOSIS — R69 Illness, unspecified: Secondary | ICD-10-CM | POA: Diagnosis not present

## 2016-08-27 MED ORDER — LIDOCAINE 5 % EX PTCH: 1.0000 | MEDICATED_PATCH | CUTANEOUS | 11 refills | Status: AC

## 2016-08-27 MED ORDER — CLONAZEPAM 0.5 MG PO TABS: 0.2500 mg | ORAL_TABLET | Freq: Every day | ORAL | 0 refills | Status: AC | PRN
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
## 2016-09-03 DIAGNOSIS — R69 Illness, unspecified: Secondary | ICD-10-CM | POA: Diagnosis not present

## 2016-09-04 DIAGNOSIS — R69 Illness, unspecified: Secondary | ICD-10-CM | POA: Diagnosis not present

## 2016-09-10 ENCOUNTER — Ambulatory Visit (INDEPENDENT_AMBULATORY_CARE_PROVIDER_SITE_OTHER): Payer: Medicare HMO | Admitting: Family Medicine

## 2016-09-10 ENCOUNTER — Ambulatory Visit: Payer: Medicare Other

## 2016-09-10 VITALS — BP 139/73 | HR 97 | Ht 65.0 in | Wt 170.7 lb

## 2016-09-10 DIAGNOSIS — Z114 Encounter for screening for human immunodeficiency virus [HIV]: Secondary | ICD-10-CM | POA: Diagnosis not present

## 2016-09-10 DIAGNOSIS — R69 Illness, unspecified: Secondary | ICD-10-CM | POA: Diagnosis not present

## 2016-09-10 DIAGNOSIS — R030 Elevated blood-pressure reading, without diagnosis of hypertension: Secondary | ICD-10-CM | POA: Diagnosis not present

## 2016-09-10 DIAGNOSIS — Z1159 Encounter for screening for other viral diseases: Secondary | ICD-10-CM | POA: Diagnosis not present

## 2016-09-10 NOTE — Progress Notes (Signed)
   Subjective:    Patient ID: Heather LimLinda K Dennis, female    DOB: 08/14/1965, 51 y.o.   MRN: [id number]  HPI    Review of Systems     Objective:   Physical Exam        Assessment & Plan:  Borderline blood pressure today. Given handout on the DASH diet encouraged her to work on this over the next several months.  Come back in for repeat blood pressure check.  Nani Gasseratherine Metheney, MD

## 2016-09-10 NOTE — Patient Instructions (Signed)
DASH Eating Plan DASH stands for "Dietary Approaches to Stop Hypertension." The DASH eating plan is a healthy eating plan that has been shown to reduce high blood pressure (hypertension). It may also reduce your risk for type 2 diabetes, heart disease, and stroke. The DASH eating plan may also help with weight loss. What are tips for following this plan? General guidelines  Avoid eating more than 2,300 mg (milligrams) of salt (sodium) a day. If you have hypertension, you may need to reduce your sodium intake to 1,500 mg a day.  Limit alcohol intake to no more than 1 drink a day for nonpregnant women and 2 drinks a day for men. One drink equals 12 oz of beer, 5 oz of wine, or 1 oz of hard liquor.  Work with your health care provider to maintain a healthy body weight or to lose weight. Ask what an ideal weight is for you.  Get at least 30 minutes of exercise that causes your heart to beat faster (aerobic exercise) most days of the week. Activities may include walking, swimming, or biking.  Work with your health care provider or diet and nutrition specialist (dietitian) to adjust your eating plan to your individual calorie needs. Reading food labels  Check food labels for the amount of sodium per serving. Choose foods with less than 5 percent of the Daily Value of sodium. Generally, foods with less than 300 mg of sodium per serving fit into this eating plan.  To find whole grains, look for the word "whole" as the first word in the ingredient list. Shopping  Buy products labeled as "low-sodium" or "no salt added."  Buy fresh foods. Avoid canned foods and premade or frozen meals. Cooking  Avoid adding salt when cooking. Use salt-free seasonings or herbs instead of table salt or sea salt. Check with your health care provider or pharmacist before using salt substitutes.  Do not fry foods. Cook foods using healthy methods such as baking, boiling, grilling, and broiling instead.  Cook with  heart-healthy oils, such as olive, canola, soybean, or sunflower oil. Meal planning   Eat a balanced diet that includes: ? 5 or more servings of fruits and vegetables each day. At each meal, try to fill half of your plate with fruits and vegetables. ? Up to 6-8 servings of whole grains each day. ? Less than 6 oz of lean meat, poultry, or fish each day. A 3-oz serving of meat is about the same size as a deck of cards. One egg equals 1 oz. ? 2 servings of low-fat dairy each day. ? A serving of nuts, seeds, or beans 5 times each week. ? Heart-healthy fats. Healthy fats called Omega-3 fatty acids are found in foods such as flaxseeds and coldwater fish, like sardines, salmon, and mackerel.  Limit how much you eat of the following: ? Canned or prepackaged foods. ? Food that is high in trans fat, such as fried foods. ? Food that is high in saturated fat, such as fatty meat. ? Sweets, desserts, sugary drinks, and other foods with added sugar. ? Full-fat dairy products.  Do not salt foods before eating.  Try to eat at least 2 vegetarian meals each week.  Eat more home-cooked food and less restaurant, buffet, and fast food.  When eating at a restaurant, ask that your food be prepared with less salt or no salt, if possible. What foods are recommended? The items listed may not be a complete list. Talk with your dietitian about what   dietary choices are best for you. Grains Whole-grain or whole-wheat bread. Whole-grain or whole-wheat pasta. Brown rice. Oatmeal. Quinoa. Bulgur. Whole-grain and low-sodium cereals. Pita bread. Low-fat, low-sodium crackers. Whole-wheat flour tortillas. Vegetables Fresh or frozen vegetables (raw, steamed, roasted, or grilled). Low-sodium or reduced-sodium tomato and vegetable juice. Low-sodium or reduced-sodium tomato sauce and tomato paste. Low-sodium or reduced-sodium canned vegetables. Fruits All fresh, dried, or frozen fruit. Canned fruit in natural juice (without  added sugar). Meat and other protein foods Skinless chicken or turkey. Ground chicken or turkey. Pork with fat trimmed off. Fish and seafood. Egg whites. Dried beans, peas, or lentils. Unsalted nuts, nut butters, and seeds. Unsalted canned beans. Lean cuts of beef with fat trimmed off. Low-sodium, lean deli meat. Dairy Low-fat (1%) or fat-free (skim) milk. Fat-free, low-fat, or reduced-fat cheeses. Nonfat, low-sodium ricotta or cottage cheese. Low-fat or nonfat yogurt. Low-fat, low-sodium cheese. Fats and oils Soft margarine without trans fats. Vegetable oil. Low-fat, reduced-fat, or light mayonnaise and salad dressings (reduced-sodium). Canola, safflower, olive, soybean, and sunflower oils. Avocado. Seasoning and other foods Herbs. Spices. Seasoning mixes without salt. Unsalted popcorn and pretzels. Fat-free sweets. What foods are not recommended? The items listed may not be a complete list. Talk with your dietitian about what dietary choices are best for you. Grains Baked goods made with fat, such as croissants, muffins, or some breads. Dry pasta or rice meal packs. Vegetables Creamed or fried vegetables. Vegetables in a cheese sauce. Regular canned vegetables (not low-sodium or reduced-sodium). Regular canned tomato sauce and paste (not low-sodium or reduced-sodium). Regular tomato and vegetable juice (not low-sodium or reduced-sodium). Pickles. Olives. Fruits Canned fruit in a light or heavy syrup. Fried fruit. Fruit in cream or butter sauce. Meat and other protein foods Fatty cuts of meat. Ribs. Fried meat. Bacon. Sausage. Bologna and other processed lunch meats. Salami. Fatback. Hotdogs. Bratwurst. Salted nuts and seeds. Canned beans with added salt. Canned or smoked fish. Whole eggs or egg yolks. Chicken or turkey with skin. Dairy Whole or 2% milk, cream, and half-and-half. Whole or full-fat cream cheese. Whole-fat or sweetened yogurt. Full-fat cheese. Nondairy creamers. Whipped toppings.  Processed cheese and cheese spreads. Fats and oils Butter. Stick margarine. Lard. Shortening. Ghee. Bacon fat. Tropical oils, such as coconut, palm kernel, or palm oil. Seasoning and other foods Salted popcorn and pretzels. Onion salt, garlic salt, seasoned salt, table salt, and sea salt. Worcestershire sauce. Tartar sauce. Barbecue sauce. Teriyaki sauce. Soy sauce, including reduced-sodium. Steak sauce. Canned and packaged gravies. Fish sauce. Oyster sauce. Cocktail sauce. Horseradish that you find on the shelf. Ketchup. Mustard. Meat flavorings and tenderizers. Bouillon cubes. Hot sauce and Tabasco sauce. Premade or packaged marinades. Premade or packaged taco seasonings. Relishes. Regular salad dressings. Where to find more information:  National Heart, Lung, and Blood Institute: www.nhlbi.nih.gov  American Heart Association: www.heart.org Summary  The DASH eating plan is a healthy eating plan that has been shown to reduce high blood pressure (hypertension). It may also reduce your risk for type 2 diabetes, heart disease, and stroke.  With the DASH eating plan, you should limit salt (sodium) intake to 2,300 mg a day. If you have hypertension, you may need to reduce your sodium intake to 1,500 mg a day.  When on the DASH eating plan, aim to eat more fresh fruits and vegetables, whole grains, lean proteins, low-fat dairy, and heart-healthy fats.  Work with your health care provider or diet and nutrition specialist (dietitian) to adjust your eating plan to your individual   calorie needs. This information is not intended to replace advice given to you by your health care provider. Make sure you discuss any questions you have with your health care provider. Document Released: 05/23/2011 Document Revised: 05/27/2016 Document Reviewed: 05/27/2016 Elsevier Interactive Patient Education  2017 Elsevier Inc.  

## 2016-09-10 NOTE — Progress Notes (Signed)
  Subjective:    Patient ID: Heather LimLinda K Large, female    DOB: 06/04/1966, 51 y.o.   MRN: [id number]  HPI Pt is here for a BP check. Pt denies chest pain, shortness of breath, palpitations, headaches, or any problems with medication.     Review of Systems     Objective:   Physical Exam        Assessment & Plan:  Pt advised to schedule a follow-up with nurse in 30 days..Marland Kitchen

## 2016-09-11 ENCOUNTER — Telehealth: Payer: Self-pay | Admitting: *Deleted

## 2016-09-11 DIAGNOSIS — R69 Illness, unspecified: Secondary | ICD-10-CM | POA: Diagnosis not present

## 2016-09-11 NOTE — Telephone Encounter (Signed)
Received letter from [employer] stating that lidoderm was not covered by insurance because it is not FDA approved for treating pain, nerve damage or complex regional pain syndrome type 2 or osteoarthritis

## 2016-09-11 NOTE — Telephone Encounter (Signed)
This was the answer from the insurance company after a PA was submitted

## 2016-09-11 NOTE — Telephone Encounter (Signed)
Yes, let move forward with PA.

## 2016-09-12 ENCOUNTER — Encounter: Payer: Self-pay | Admitting: *Deleted

## 2016-09-12 MED ORDER — DICLOFENAC SODIUM 1 % TD GEL: 4.0000 g | Freq: Four times a day (QID) | TRANSDERMAL | 99 refills | Status: DC

## 2016-09-12 NOTE — Telephone Encounter (Signed)
Oh OK. Please let pt know and see whast she wants to do.

## 2016-09-12 NOTE — Telephone Encounter (Signed)
Left message on patient vm.

## 2016-09-12 NOTE — Telephone Encounter (Signed)
Spoke with patient on the phone and she was very upset that the insurance company denied coverage for lidocaine. She states "Dr. Linford ArnoldMetheney is aware of my condition and she can fix this." I didn't try to tell her that I would appeal I just let her vent. If feel Voltaren gel is appropriate can you please send this to pharmacy? I will send the appeal.  Also so that I can go ahead and give her answer in case appeal is denied what do you think about otc salonpas or something similar? I looked it up and it has lidocaine in it.

## 2016-09-12 NOTE — Telephone Encounter (Signed)
Will start a pill process. The meantime we'll try sitting her prescription for Voltaren gel. We'll start with 1% but we can certainly adjust that depending on the coverage with her insurance.

## 2016-09-12 NOTE — Telephone Encounter (Signed)
Also advised patient about Goodrx since most likely insurance is not going to pay for Voltaren

## 2016-09-12 NOTE — Telephone Encounter (Signed)
Faxed appeal to [phone]

## 2016-09-18 ENCOUNTER — Encounter: Payer: Self-pay | Admitting: *Deleted

## 2016-09-18 MED ORDER — DICLOFENAC SODIUM 2 % TD SOLN: TRANSDERMAL | 99 refills | Status: DC

## 2016-09-18 NOTE — Telephone Encounter (Signed)
Call pt:  I called in [employer] to [facility] and lets see if covered.

## 2016-09-18 NOTE — Telephone Encounter (Signed)
The appeal was denied, called and left message for patient to call me back

## 2016-09-18 NOTE — Telephone Encounter (Signed)
Patient notified

## 2016-09-18 NOTE — Telephone Encounter (Signed)
Patient notified; she has not pick up  The first  voltaren gel prescription  yet but she asked that you send in the higher strength.  Also gave her the [facility] phone #

## 2016-09-25 DIAGNOSIS — Z0000 Encounter for general adult medical examination without abnormal findings: Secondary | ICD-10-CM | POA: Diagnosis not present

## 2016-09-25 DIAGNOSIS — Z1159 Encounter for screening for other viral diseases: Secondary | ICD-10-CM | POA: Diagnosis not present

## 2016-09-26 LAB — CBC WITH DIFFERENTIAL/PLATELET
Basophils Absolute: 0 cells/uL (ref 0–200)
Basophils Relative: 0 %
EOS PCT: 1 %
Eosinophils Absolute: 98 cells/uL (ref 15–500)
HEMATOCRIT: 42.3 % (ref 35.0–45.0)
Hemoglobin: 14.3 g/dL (ref 11.7–15.5)
LYMPHS PCT: 30 %
Lymphs Abs: 2940 cells/uL (ref 850–3900)
MCH: 31 pg (ref 27.0–33.0)
MCHC: 33.8 g/dL (ref 32.0–36.0)
MCV: 91.8 fL (ref 80.0–100.0)
MPV: 9.9 fL (ref 7.5–12.5)
Monocytes Absolute: 686 cells/uL (ref 200–950)
Monocytes Relative: 7 %
Neutro Abs: 6076 cells/uL (ref 1500–7800)
Neutrophils Relative %: 62 %
Platelets: 407 10*3/uL — ABNORMAL HIGH (ref 140–400)
RBC: 4.61 MIL/uL (ref 3.80–5.10)
RDW: 13.6 % (ref 11.0–15.0)
WBC: 9.8 10*3/uL (ref 3.8–10.8)

## 2016-09-26 LAB — LIPID PANEL W/REFLEX DIRECT LDL
CHOLESTEROL: 213 mg/dL — AB (ref <200)
HDL: 72 mg/dL (ref >50)
LDL-Cholesterol: 112 mg/dL — ABNORMAL HIGH
NON-HDL CHOLESTEROL (CALC): 141 mg/dL — AB (ref <130)
Total CHOL/HDL Ratio: 3 Ratio (ref <5.0)
Triglycerides: 177 mg/dL — ABNORMAL HIGH (ref <150)

## 2016-09-26 LAB — COMPLETE METABOLIC PANEL WITH GFR
ALBUMIN: 4.5 g/dL (ref 3.6–5.1)
ALK PHOS: 87 U/L (ref 33–130)
ALT: 13 U/L (ref 6–29)
AST: 16 U/L (ref 10–35)
BUN: 14 mg/dL (ref 7–25)
CO2: 24 mmol/L (ref 20–31)
CREATININE: 0.95 mg/dL (ref 0.50–1.05)
Calcium: 9.8 mg/dL (ref 8.6–10.4)
Chloride: 103 mmol/L (ref 98–110)
GFR, Est African American: 80 mL/min (ref >=60)
GFR, Est Non African American: 69 mL/min (ref >=60)
Glucose, Bld: 126 mg/dL — ABNORMAL HIGH (ref 65–99)
Potassium: 4.2 mmol/L (ref 3.5–5.3)
Sodium: 141 mmol/L (ref 135–146)
Total Bilirubin: 0.3 mg/dL (ref 0.2–1.2)
Total Protein: 7.1 g/dL (ref 6.1–8.1)

## 2016-09-26 LAB — TSH: TSH: 1.45 mIU/L

## 2016-09-26 LAB — HEPATITIS C ANTIBODY: HCV Ab: NEGATIVE

## 2016-09-27 ENCOUNTER — Other Ambulatory Visit: Payer: Self-pay

## 2016-09-27 DIAGNOSIS — R7989 Other specified abnormal findings of blood chemistry: Secondary | ICD-10-CM

## 2016-10-01 ENCOUNTER — Telehealth: Payer: Self-pay | Admitting: Family Medicine

## 2016-10-01 DIAGNOSIS — R69 Illness, unspecified: Secondary | ICD-10-CM | POA: Diagnosis not present

## 2016-10-01 NOTE — Telephone Encounter (Signed)
Pt stated she as suppose to get a colon test mailed to her and she never received it and she is also still waiting on the medication that she needs for the joint pain in her knees. Thanks

## 2016-10-08 ENCOUNTER — Ambulatory Visit (INDEPENDENT_AMBULATORY_CARE_PROVIDER_SITE_OTHER): Payer: Medicare HMO | Admitting: Family Medicine

## 2016-10-08 VITALS — BP 153/75 | HR 88

## 2016-10-08 DIAGNOSIS — R030 Elevated blood-pressure reading, without diagnosis of hypertension: Secondary | ICD-10-CM | POA: Diagnosis not present

## 2016-10-08 MED ORDER — METOPROLOL SUCCINATE ER 25 MG PO TB24: 25.0000 mg | ORAL_TABLET | Freq: Every day | ORAL | 0 refills | Status: DC

## 2016-10-08 NOTE — Progress Notes (Signed)
Agree with above.  Heather Metheney, MD  

## 2016-10-08 NOTE — Progress Notes (Signed)
Patient came into clinic today for BP check. Denies and chest pain, palpitations, headaches, blurred vision, etc. Pt does report she is in a lot of pain today due to the weather affecting her leg pain. Per PCP, Rx sent for metoprolol XR  to Pt preferred pharmacy. Pt advised to return to clinic in 2 weeks for recheck. Verbalized understanding, no further questions.

## 2016-10-16 DIAGNOSIS — R69 Illness, unspecified: Secondary | ICD-10-CM | POA: Diagnosis not present

## 2016-10-22 ENCOUNTER — Ambulatory Visit (INDEPENDENT_AMBULATORY_CARE_PROVIDER_SITE_OTHER): Payer: Medicare HMO | Admitting: Family Medicine

## 2016-10-22 ENCOUNTER — Other Ambulatory Visit (HOSPITAL_COMMUNITY)
Admission: RE | Admit: 2016-10-22 | Discharge: 2016-10-22 | Disposition: A | Discharge status: Home / Self Care | Payer: Medicare HMO | Source: Ambulatory Visit | Attending: Obstetrics & Gynecology | Admitting: Obstetrics & Gynecology

## 2016-10-22 ENCOUNTER — Encounter: Payer: Self-pay | Admitting: Obstetrics & Gynecology

## 2016-10-22 ENCOUNTER — Other Ambulatory Visit: Payer: Self-pay

## 2016-10-22 ENCOUNTER — Ambulatory Visit (INDEPENDENT_AMBULATORY_CARE_PROVIDER_SITE_OTHER): Payer: Medicare HMO | Admitting: Obstetrics & Gynecology

## 2016-10-22 VITALS — BP 119/67 | HR 80

## 2016-10-22 VITALS — BP 111/68 | HR 75 | Ht 65.0 in | Wt 170.0 lb

## 2016-10-22 DIAGNOSIS — Z1151 Encounter for screening for human papillomavirus (HPV): Secondary | ICD-10-CM

## 2016-10-22 DIAGNOSIS — I10 Essential (primary) hypertension: Secondary | ICD-10-CM | POA: Diagnosis not present

## 2016-10-22 DIAGNOSIS — Z01419 Encounter for gynecological examination (general) (routine) without abnormal findings: Secondary | ICD-10-CM

## 2016-10-22 DIAGNOSIS — R7989 Other specified abnormal findings of blood chemistry: Secondary | ICD-10-CM

## 2016-10-22 DIAGNOSIS — Z124 Encounter for screening for malignant neoplasm of cervix: Secondary | ICD-10-CM

## 2016-10-22 DIAGNOSIS — R69 Illness, unspecified: Secondary | ICD-10-CM | POA: Diagnosis not present

## 2016-10-22 DIAGNOSIS — Z8489 Family history of other specified conditions: Secondary | ICD-10-CM

## 2016-10-22 MED ORDER — LISINOPRIL 10 MG PO TABS: 10.0000 mg | ORAL_TABLET | Freq: Every day | ORAL | 1 refills | Status: DC

## 2016-10-22 NOTE — Addendum Note (Signed)
Addended by: Collie SiadICHARDSON, Stanton Kissoon M on: 10/22/2016 04:32 PM   Modules accepted: Orders

## 2016-10-22 NOTE — Progress Notes (Signed)
   Subjective:    Patient ID: Heather Dennis, female    DOB: 09/14/1965, 51 y.o.   MRN: [id number]  HPI    Review of Systems     Objective:   Physical Exam        Assessment & Plan:  Hypertension-blood pressure looks absolutely fantastic today. Certainly if she feels like she is expressing some side effects we could try something different. We could try an ACE inhibitor such as lisinopril. Or even a calcium channel blocker like amlodipine. If she is okay with this we can try lisinopril 10 mg once a day and then have her come back in 2-3 weeks for blood pressure check again.  Nani Gasseratherine Tomica Arseneault, MD
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
## 2016-10-22 NOTE — Progress Notes (Signed)
Pt advised of PCP's recommendation, she will stop metoprolol and start lisinopril.  New Rx sent. She will call for NV in 2-3 weeks for BP recheck.

## 2016-10-22 NOTE — Progress Notes (Signed)
Pt came into clinic today for BP check. Pt is taking metoprolol 25mg  daily and BP is at goal. Pt denied any headaches, blurred vision, chest pain, palpitations. Pt does report she has had some other side effects from the medication. She has noticed an increase in her leg pain, and mood swings. States she can go from crying straight to anger. Pt states she looked online and these were possible sife effects from the medication. Pt is in the process of getting a divorce. Reports this is a very stressful situation, she does have a therapist. States she can only get in to see her therapist once a month, she stays booked up. Will route to PCP for review and recommendation.

## 2016-10-25 LAB — CYTOLOGY - PAP
Diagnosis: NEGATIVE
HPV: NOT DETECTED

## 2016-11-05 ENCOUNTER — Other Ambulatory Visit: Payer: Self-pay | Admitting: Family Medicine

## 2016-11-05 DIAGNOSIS — R600 Localized edema: Secondary | ICD-10-CM | POA: Diagnosis not present

## 2016-11-05 DIAGNOSIS — I87391 Chronic venous hypertension (idiopathic) with other complications of right lower extremity: Secondary | ICD-10-CM | POA: Diagnosis not present

## 2016-11-05 DIAGNOSIS — M79604 Pain in right leg: Secondary | ICD-10-CM | POA: Diagnosis not present

## 2016-11-28 DIAGNOSIS — R7989 Other specified abnormal findings of blood chemistry: Secondary | ICD-10-CM | POA: Diagnosis not present

## 2016-11-28 LAB — CBC WITH DIFFERENTIAL/PLATELET
BASOS ABS: 0 {cells}/uL (ref 0–200)
Basophils Relative: 0 %
EOS PCT: 1 %
Eosinophils Absolute: 98 cells/uL (ref 15–500)
HCT: 40.4 % (ref 35.0–45.0)
HEMOGLOBIN: 13.5 g/dL (ref 11.7–15.5)
Lymphocytes Relative: 36 %
Lymphs Abs: 3528 cells/uL (ref 850–3900)
MCH: 30.3 pg (ref 27.0–33.0)
MCHC: 33.4 g/dL (ref 32.0–36.0)
MCV: 90.8 fL (ref 80.0–100.0)
MPV: 9.6 fL (ref 7.5–12.5)
Monocytes Absolute: 686 cells/uL (ref 200–950)
Monocytes Relative: 7 %
NEUTROS PCT: 56 %
Neutro Abs: 5488 cells/uL (ref 1500–7800)
Platelets: 375 10*3/uL (ref 140–400)
RBC: 4.45 MIL/uL (ref 3.80–5.10)
RDW: 13.7 % (ref 11.0–15.0)
WBC: 9.8 10*3/uL (ref 3.8–10.8)

## 2016-12-03 DIAGNOSIS — M79604 Pain in right leg: Secondary | ICD-10-CM | POA: Diagnosis not present

## 2016-12-04 DIAGNOSIS — R69 Illness, unspecified: Secondary | ICD-10-CM | POA: Diagnosis not present

## 2016-12-11 DIAGNOSIS — R69 Illness, unspecified: Secondary | ICD-10-CM | POA: Diagnosis not present

## 2016-12-15 ENCOUNTER — Other Ambulatory Visit: Payer: Self-pay | Admitting: Family Medicine

## 2016-12-16 ENCOUNTER — Ambulatory Visit (INDEPENDENT_AMBULATORY_CARE_PROVIDER_SITE_OTHER): Payer: Medicare HMO | Admitting: Family Medicine

## 2016-12-16 VITALS — BP 127/67 | HR 93 | Wt 173.0 lb

## 2016-12-16 DIAGNOSIS — I10 Essential (primary) hypertension: Secondary | ICD-10-CM | POA: Diagnosis not present

## 2016-12-16 MED ORDER — LISINOPRIL 10 MG PO TABS: 10.0000 mg | ORAL_TABLET | Freq: Every day | ORAL | 1 refills | Status: DC

## 2016-12-16 NOTE — Progress Notes (Signed)
Pt here for bp check. She is doing well on current regimen. She checks her bp at home and has not had any spikes or low readings.   Pt would like me to relay a message to pcp regarding her clonazepam. She wanted to know if Dr. Linford ArnoldMetheney would consider changing this to Ativan for her since it doesn't have as many side effects and she believes that it would help her to sleep better. She stated that she takes benadryl along with the clonazepam (prn) and this still does not help her to sleep throughout the night. I asked her about taking Valerian root she stated that she believes that she has taken this in the past and doesn't feel as if this has helped her. I informed pt that Dr. Linford ArnoldMetheney would more likely try her on an actual sleep medication. She stated that she would be ok with this.  Also pt reports that she is not taking the lexapro. Laureen Ochs.Ricardo Kayes, Viann Shoveonya [name]

## 2016-12-16 NOTE — Progress Notes (Signed)
I agree. I don't usually write Ativan as an outpatient. Normally that something is given for a procedure or any emergency room. I think she might actually do well with a sleep medication such as Ambien. It helps with sleep initiation and sleep maintenance so it usually is much more effective than using a benzodiazepine for sleep. It's also now generic. Or we can try a prescription that is a little bit more natural and bicipital melatonin receptors such as Rozerem. There is also a newer sleep agent on the market that has the best side effect profile thus far for a sleep aid called Belsomra.

## 2016-12-19 ENCOUNTER — Telehealth: Payer: Self-pay

## 2016-12-19 NOTE — Telephone Encounter (Signed)
Called and left a message asking for a return call.      Progress notes   Heather Dennis, Heather Dennis, CMA at 12/16/2016 1:30 PM   Status: Signed    Pt here for bp check. She is doing well on current regimen. She checks her bp at home and has not had any spikes or low readings.   Pt would like me to relay a message to pcp regarding her clonazepam. She wanted to know if Dr. Linford ArnoldMetheney would consider changing this to Ativan for her since it doesn't have as many side effects and she believes that it would help her to sleep better. She stated that she takes benadryl along with the clonazepam (prn) and this still does not help her to sleep throughout the night. I asked her about taking Valerian root she stated that she believes that she has taken this in the past and doesn't feel as if this has helped her. I informed pt that Dr. Linford ArnoldMetheney would more likely try her on an actual sleep medication. She stated that she would be ok with this.  Also pt reports that she is not taking the lexapro. Heather Dennis, Heather Dennis     Metheney, Heather D, MD at 12/16/2016 1:30 PM   Status: Signed    I agree. I don't usually write Ativan as an outpatient. Normally that something is given for a procedure or any emergency room. I think she might actually do well with a sleep medication such as Ambien. It helps with sleep initiation and sleep maintenance so it usually is much more effective than using a benzodiazepine for sleep. It's also now generic. Or we can try a prescription that is a little bit more natural and bicipital melatonin receptors such as Rozerem. There is also a newer sleep agent on the market that has the best side effect profile thus far for a sleep aid called Belsomra.

## 2016-12-19 NOTE — Progress Notes (Signed)
Message copied to a patient call encounter.

## 2016-12-20 MED ORDER — ZOLPIDEM TARTRATE 5 MG PO TABS
5.0000 mg | ORAL_TABLET | Freq: Every evening | ORAL | 0 refills | Status: DC | PRN
Start: 2016-12-20 — End: 2017-02-02

## 2016-12-20 NOTE — Telephone Encounter (Signed)
OK, will fax new rx.

## 2016-12-20 NOTE — Telephone Encounter (Signed)
Pt notified of information and would like to give Ambien a try. Please assist.

## 2016-12-23 NOTE — Telephone Encounter (Signed)
Informed pt that rx was sent in

## 2016-12-30 DIAGNOSIS — I83891 Varicose veins of right lower extremities with other complications: Secondary | ICD-10-CM | POA: Diagnosis not present

## 2016-12-30 DIAGNOSIS — I83811 Varicose veins of right lower extremities with pain: Secondary | ICD-10-CM | POA: Diagnosis not present

## 2017-01-08 DIAGNOSIS — I8311 Varicose veins of right lower extremity with inflammation: Secondary | ICD-10-CM | POA: Diagnosis not present

## 2017-01-08 DIAGNOSIS — I83811 Varicose veins of right lower extremities with pain: Secondary | ICD-10-CM | POA: Diagnosis not present

## 2017-01-15 DIAGNOSIS — R69 Illness, unspecified: Secondary | ICD-10-CM | POA: Diagnosis not present

## 2017-02-02 ENCOUNTER — Other Ambulatory Visit: Payer: Self-pay | Admitting: Family Medicine

## 2017-02-04 DIAGNOSIS — I83891 Varicose veins of right lower extremities with other complications: Secondary | ICD-10-CM | POA: Diagnosis not present

## 2017-02-04 DIAGNOSIS — I83811 Varicose veins of right lower extremities with pain: Secondary | ICD-10-CM | POA: Diagnosis not present

## 2017-02-14 ENCOUNTER — Encounter: Payer: Self-pay | Admitting: Family Medicine

## 2017-02-14 ENCOUNTER — Ambulatory Visit (INDEPENDENT_AMBULATORY_CARE_PROVIDER_SITE_OTHER): Payer: Medicare HMO | Admitting: Family Medicine

## 2017-02-14 DIAGNOSIS — M7061 Trochanteric bursitis, right hip: Secondary | ICD-10-CM | POA: Diagnosis not present

## 2017-02-14 NOTE — Patient Instructions (Addendum)
Thank you for coming in today. Get xray hip right.  Try to get the MRI report of the lumbar spine to me.  Attend PT to get the muscles in the right hip stronger.  Recheck with me in 2 weeks.    Hip Bursitis Hip bursitis is inflammation of a fluid-filled sac (bursa) in the hip joint. The bursa protects the bones in the hip joint from rubbing against each other. Hip bursitis can cause mild to moderate pain, and symptoms often come and go over time. What are the causes? This condition may be caused by:  Injury to the hip.  Overuse of the muscles that surround the hip joint.  Arthritis or gout.  Diabetes.  Thyroid disease.  Cold weather.  Infection.  In some cases, the cause may not be known. What are the signs or symptoms? Symptoms of this condition may include:  Mild or moderate pain in the hip area. Pain may get worse with movement.  Tenderness and swelling of the hip, especially on the outer side of the hip.  Symptoms may come and go. If the bursa becomes infected, you may have the following symptoms:  Fever.  Red skin and a feeling of warmth in the hip area.  How is this diagnosed? This condition may be diagnosed based on:  A physical exam.  Your medical history.  X-rays.  Removal of fluid from your inflamed bursa for testing (biopsy).  You may be sent to a health care provider who specializes in bone diseases (orthopedist) or a provider who specializes in joint inflammation (rheumatologist). How is this treated? This condition is treated by resting, raising (elevating), and applying pressure(compression) to the injured area. In some cases, this may be enough to make your symptoms go away. Treatment may also include:  Crutches.  Antibiotic medicine.  Draining fluid out of the bursa to help relieve swelling.  Injecting medicine that helps to reduce inflammation (cortisone).  Follow these instructions at home: Medicines  Take over-the-counter and  prescription medicines only as told by your health care provider.  Do not drive or operate heavy machinery while taking prescription pain medicine, or as told by your health care provider.  If you were prescribed an antibiotic, take it as told by your health care provider. Do not stop taking the antibiotic even if you start to feel better. Activity  Return to your normal activities as told by your health care provider. Ask your health care provider what activities are safe for you.  Rest and protect your hip as much as possible until your pain and swelling get better. General instructions  Wear compression wraps only as told by your health care provider.  Elevate your hip above the level of your heart as much as you can without pain. To do this, try putting a pillow under your hips while you lie down.  Do not use your hip to support your body weight until your health care provider says that you can. Use crutches as told by your health care provider.  Gently massage and stretch your injured area as often as is comfortable.  Keep all follow-up visits as told by your health care provider. This is important. How is this prevented?  Exercise regularly, as told by your health care provider.  Warm up and stretch before being active.  Cool down and stretch after being active.  If an activity irritates your hip or causes pain, avoid the activity as much as possible.  Avoid sitting down for long  periods at a time. Contact a health care provider if:  You have a fever.  You develop new symptoms.  You have difficulty walking or doing everyday activities.  You have pain that gets worse or does not get better with medicine.  You develop red skin or a feeling of warmth in your hip area. Get help right away if:  You cannot move your hip.  You have severe pain. This information is not intended to replace advice given to you by your health care provider. Make sure you discuss any  questions you have with your health care provider. Document Released: 11/23/2001 Document Revised: 11/09/2015 Document Reviewed: 01/03/2015 Elsevier Interactive Patient Education  [employer].

## 2017-02-14 NOTE — Progress Notes (Signed)
Heather Dennis is a 51 y.o. female who presents to [facility] today for right hip pain.  Patient is a long orthopedic history starting with a left patella fracture. She had significant pain in the left leg following surgical repair and subsequently has significant pain in the left leg. She essentially has had maximal medical management and is still quite symptomatic. She essentially does not use her left leg much with ambulation keeping the knee extended and using a cane or walker. She previously was managed with opiates and managed to wean herself off after having significant dependence issues. She no longer is taking any opiates.  She is here today to address her right lateral hip pain. This is been ongoing for a few months and is worse with standing and walking. Additionally she notes pain when she lays on her right side. She denies significant radiating pain weakness or numbness. She denies any recent injury to her right side.  Her quality of life is adversely affected by the right lateral hip pain. Additionally she notes home stressors is a significant source of difficulty in her life. She is currently in the midst of a contested divorce and notes extreme stress from this.   Past Medical History:  Diagnosis Date  . Carpal tunnel syndrome 07/19/2014   Right, severe. EMG done 11/05/13 at [facility].    . Chronic pain disorder 03/14/2011  . CRPS (complex regional pain syndrome), lower limb 07/19/2014  . Opioid use disorder, severe, dependence (HCC) 01/23/2016  . PTSD (post-traumatic stress disorder)   . Smoker    Past Surgical History:  Procedure Laterality Date  . CESAREAN SECTION  1981 1985  . COSMETIC SURGERY  1995 2002   Breast Aug  . KNEE SURGERY  08/25/09   x 3  . TUBAL LIGATION  1991   Social History  Substance Use Topics  . Smoking status: Former Smoker    Packs/day: 1.00  . Smokeless tobacco: Never Used  .  Alcohol use No     ROS:  As above   Medications: Current Outpatient Prescriptions  Medication Sig Dispense Refill  . acetaminophen (TYLENOL) 325 MG tablet Take 650 mg by mouth every 6 (six) hours as needed.    . clonazePAM (KLONOPIN) 0.5 MG tablet Take 0.5-1 tablets (0.25-0.5 mg total) by mouth daily as needed for anxiety. Pt reports that she is taking 0.25mg  30 tablet 0  . lidocaine (LIDODERM) 5 % Place 1 patch onto the skin daily. 30 patch 11  . lisinopril (PRINIVIL,ZESTRIL) 10 MG tablet Take 1 tablet (10 mg total) by mouth daily. 90 tablet 1  . zolpidem (AMBIEN) 5 MG tablet TAKE 1 TABLET BY MOUTH AT BEDTIME AS NEEDED FOR SLEEP 30 tablet 0   No current facility-administered medications for this visit.    Allergies  Allergen Reactions  . Gabapentin Other (See Comments)    headache  . Codeine Nausea Only     Exam:  BP 138/64   Pulse 94   Wt 166 lb (75.3 kg)   SpO2 94%   BMI 27.62 kg/m  General: Well Developed, well nourished, and in no acute distress.  Neuro/Psych: Alert and oriented x3, extra-ocular muscles intact, able to move all 4 extremities, sensation grossly intact. Skin: Warm and dry, no rashes noted.  Respiratory: Not using accessory muscles, speaking in full sentences, trachea midline.  Cardiovascular: Pulses palpable, no extremity edema. Abdomen: Does not appear distended. MSK:  Right hip normal-appearing normal  motion. Tender to palpation greater trochanter. Hip abduction strength is diminished 4/5.  Contralateral right hip exam not performed.    X-ray right hip pending  No results found for this or any previous visit (from the past 48 hour(s)). No results found.    Assessment and Plan: 51 y.o. female with right lateral hip pain very likely trochanteric bursitis. I believe because patient is essentially doing most of her weightbearing and mobility with her right leg she is suffering from tendinopathy of the hip abductors consistent with  trochanteric bursitis. She should do quite well with physical therapy. I'm not optimistic about physical therapy helping her left leg at all as it has not in the past.  Plan to recheck in 2 weeks.    Orders Placed This Encounter  Procedures  . DG HIP UNILAT WITH PELVIS 2-3 VIEWS RIGHT    Standing Status:   Future    Standing Expiration Date:   04/16/2018    Order Specific Question:   Reason for Exam (SYMPTOM  OR DIAGNOSIS REQUIRED)    Answer:   eval pain right lateral hip    Order Specific Question:   Is patient pregnant?    Answer:   No    Order Specific Question:   Preferred imaging location?    Answer:   [facility]    Order Specific Question:   Radiology Contrast Protocol - do NOT remove file path    Answer:   [email or web address]  . Ambulatory referral to Physical Therapy    Referral Priority:   Routine    Referral Type:   Physical Medicine    Referral Reason:   Specialty Services Required    Requested Specialty:   Physical Therapy   No orders of the defined types were placed in this encounter.   Discussed warning signs or symptoms. Please see discharge instructions. Patient expresses understanding.  I spent 40 minutes with this patient, greater than 50% was face-to-face time counseling regarding differential diagnosis treatment plan and options..Marland Kitchen

## 2017-02-19 ENCOUNTER — Ambulatory Visit (INDEPENDENT_AMBULATORY_CARE_PROVIDER_SITE_OTHER): Payer: Medicare HMO

## 2017-02-19 DIAGNOSIS — M8588 Other specified disorders of bone density and structure, other site: Secondary | ICD-10-CM | POA: Diagnosis not present

## 2017-02-19 DIAGNOSIS — M7061 Trochanteric bursitis, right hip: Secondary | ICD-10-CM

## 2017-02-19 DIAGNOSIS — M1611 Unilateral primary osteoarthritis, right hip: Secondary | ICD-10-CM | POA: Diagnosis not present

## 2017-02-21 ENCOUNTER — Other Ambulatory Visit: Payer: Self-pay | Admitting: Family Medicine

## 2017-02-26 ENCOUNTER — Telehealth: Payer: Self-pay | Admitting: Family Medicine

## 2017-02-26 ENCOUNTER — Ambulatory Visit (INDEPENDENT_AMBULATORY_CARE_PROVIDER_SITE_OTHER): Payer: Medicare HMO | Admitting: Family Medicine

## 2017-02-26 ENCOUNTER — Encounter: Payer: Self-pay | Admitting: Rehabilitative and Restorative Service Providers"

## 2017-02-26 ENCOUNTER — Ambulatory Visit (INDEPENDENT_AMBULATORY_CARE_PROVIDER_SITE_OTHER): Payer: Medicare HMO | Admitting: Rehabilitative and Restorative Service Providers"

## 2017-02-26 ENCOUNTER — Encounter: Payer: Self-pay | Admitting: Family Medicine

## 2017-02-26 VITALS — BP 125/107 | HR 100 | Wt 168.0 lb

## 2017-02-26 DIAGNOSIS — M25551 Pain in right hip: Secondary | ICD-10-CM | POA: Diagnosis not present

## 2017-02-26 DIAGNOSIS — M6281 Muscle weakness (generalized): Secondary | ICD-10-CM | POA: Diagnosis not present

## 2017-02-26 DIAGNOSIS — R2689 Other abnormalities of gait and mobility: Secondary | ICD-10-CM | POA: Diagnosis not present

## 2017-02-26 DIAGNOSIS — R29898 Other symptoms and signs involving the musculoskeletal system: Secondary | ICD-10-CM

## 2017-02-26 NOTE — Telephone Encounter (Signed)
I called the pharmacy.  You should be able to pick up the ambien on the 18th.  You just tried to refill it too early for the pharmacy.   You should be able to get it.

## 2017-02-26 NOTE — Therapy (Addendum)
[place] [address] [place], [place], [address] Phone: [phone]   Fax:  [phone]  Physical Therapy Treatment  Patient Details  Name: Heather Dennis MRN: [id number] Date of Birth: 1966-02-28 Referring Provider: Dr Lynne Leader   Encounter Date: 02/26/2017      PT End of Session - 02/26/17 1015    Visit Number 1   Number of Visits 12   Date for PT Re-Evaluation 04/09/17   PT Start Time 7510   PT Stop Time 1118   PT Time Calculation (min) 63 min   Activity Tolerance Patient tolerated treatment well      Past Medical History:  Diagnosis Date  . Carpal tunnel syndrome 07/19/2014   Right, severe. EMG done 11/05/13 at [facility].    . Chronic pain disorder 03/14/2011  . CRPS (complex regional pain syndrome), lower limb 07/19/2014  . Opioid use disorder, severe, dependence ([place]) 01/23/2016  . PTSD (post-traumatic stress disorder)   . Smoker     Past Surgical History:  Procedure Laterality Date  . [place]  . [place] 2002   Breast Aug  . KNEE SURGERY  08/25/09   x 3  . TUBAL LIGATION  1991    There were no vitals filed for this visit.      Subjective Assessment - 02/26/17 1024    Subjective Patient reports onset of Rt hip pain several months with increase in the past 6 months. Pain with functional acitivities; standing; walking.    Pertinent History Lt knee surgeries x 3 2011 failed to gain ROM and function - functionally fused; patella dysfunction; Rt knee pain; LBP stenosis and DDD; bilat hip pain; pull of Lt Achilles tendon; bilat carpal tunnel worse Rt than Lt    How long can you sit comfortably? not at all    How long can you stand comfortably? not at all    How long can you walk comfortably? not at all    Diagnostic tests xrays; MRI    Patient Stated Goals make sure she maintains the function of hips    Currently in Pain? Yes   Pain Score 6    Pain  Location Hip   Pain Orientation Right   Pain Descriptors / Indicators Constant;Sharp;Aching   Pain Type Chronic pain   Pain Radiating Towards hip and LB    Pain Onset More than a month ago   Pain Frequency Constant   Aggravating Factors  moving    Pain Relieving Factors ice; heat; TENS; rest; tumeric    Multiple Pain Sites --  chronic pain in multiple sites LB; bilat hips; bilat knees chronic in nature             [facility] PT Assessment - 02/26/17 0001      Assessment   Medical Diagnosis Rt trochanteric bursitis   Referring Provider Dr Lynne Leader    Onset Date/Surgical Date 08/15/16  pain started 2013    Hand Dominance Right   Next MD Visit 02/26/17   Prior Therapy yes for Lt knee      Precautions   Precautions None     Balance Screen   Has the patient fallen in the past 6 months No   Has the patient had a decrease in activity level because of a fear of falling?  No   Is the patient reluctant to leave their home because of a fear of falling?  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    Leisure sedentary; meal prep and some shopping      Observation/Other Assessments   Focus on Therapeutic Outcomes (FOTO)  73% limitation      Sensation   Additional Comments intermittent tingling Rt hip with prolonged standing      Posture/Postural Control   Posture Comments fwd flexed at hips; wt shifted to the Rt; LE externally rotated      AROM   Right/Left Hip --  limited Rt hip ext/rotation; Lt throughout    Right/Left Knee --  Rt WNL's flex/ext; Lt 0 ext to 15 deg flex   Right/Left Ankle --  DF Rt 2 deg, Lt 0; PF Rt 45 deg, Lt 43 deg      Strength   Right Hip Flexion 4/5   Right Hip Extension 4-/5   Right Hip ABduction 4-/5   Left Hip Flexion 3+/5   Left Hip Extension 3-/5   Left Hip  ABduction 3/5     Flexibility   Hamstrings Rt 71; Lt 90 deg    ITB tight bilat    Piriformis tight Rt      Palpation   Spinal mobility limited lumbar mobilty with CPA mobs    Palpation comment muscular tightness and atrophy noted Lt LE through hip/thigh/calf; muscular tightness noted Rt hip flexors/abductors/extensors; tightness noted bilat lumbar paraspinals into QL and lats      Ambulation/Gait   Gait Comments ambulates with cane in Rt LE with Lt LT in full extension at hip and knee; circumduction of Lt LE during swing phase with decreased wt bearing phase on Lt. Rt LE is flexed at hip with ER of hip. Trunk is forward flexed                      [facility] Adult PT Treatment/Exercise - 02/26/17 0001      Lumbar Exercises: Supine   AB Set Limitations core stabilization 10 sec x 10 unable to coordinate core contraction - worked on pelvic floor and abdominal setting      Knee/Hip Exercises: Stretches   Passive Hamstring Stretch Right;Left;2 reps;30 seconds   Passive Hamstring Stretch Limitations supine with strap    ITB Stretch Right;2 reps;30 seconds   ITB Stretch Limitations supine with strap    Piriformis Stretch 2 reps;30 seconds   Piriformis Stretch Limitations supine with strap travell modifiied      Modalities   Modalities Electrical Stimulation;Moist Heat     Moist Heat Therapy   Number Minutes Moist Heat 20 Minutes  pt in Rt sidelying, with pillow between knees   Moist Heat Location Hip;Lumbar Spine  Rt hip     Electrical Stimulation   Electrical Stimulation Location Rt hip and bilat low back   Electrical Stimulation Action IFC   Electrical Stimulation Parameters to tolerance   Electrical Stimulation Goals Pain                PT Education - 02/26/17 1056    Education provided Yes   Education Details HEP   Person(s) Educated Patient   Methods Explanation;Demonstration;Tactile cues;Verbal cues;Handout   Comprehension Verbalized  understanding;Returned demonstration;Verbal cues required;Tactile cues required             PT Long Term Goals - 02/26/17 1324  PT LONG TERM GOAL #1   Title Improve standing posture and alignment with patient to stand with improved wt bearing Lt LE and incresed trunk extension 04/09/17   Time 6   Period Weeks   Status New     PT LONG TERM GOAL #2   Title Increase strength bilat LE's by 1/2 muscle grade 04/09/17   Time 6   Period Weeks   Status New     PT LONG TERM GOAL #3   Title Decrease pain by 20-30% allowing patient to perform exercise program on a regular basis 04/09/17   Time 6   Period Weeks   Status New     PT LONG TERM GOAL #4   Title Independent in HEP 04/09/17   Time 6   Period Weeks   Status New     PT LONG TERM GOAL #5   Title Improve FOTO to </= 56% limitation 04/09/17   Time 6   Period Weeks   Status New               Plan - 03/05/2017 1302    Clinical Impression Statement Heather Dennis presents with ocmplicated musculoskeletal problems and deficits. She has history of multiple Lt knee surgeries and presents with significant limitations in LE mobility and strength. She has abnormal posture and alignment as well as abnormal gait pattern. Patient has muscular tightness through bilat lumbar and hip musculature greatest in Rt posterior hip including pirifromis and glut musculature. She is sedentary and most often  sits with LE's in hip flexion and knee extension with her back propped on pillows on her bed. Patient will benefit from a trial of PT to address pain and limited function.    Clinical Presentation Evolving   Clinical Decision Making Moderate   Rehab Potential Good   PT Frequency 2x / week   PT Duration 6 weeks   PT Treatment/Interventions Patient/family education;ADLs/Self Care Home Management;Cryotherapy;Electrical Stimulation;Iontophoresis 26m/ml Dexamethasone;Moist Heat;Ultrasound;Dry needling;Manual techniques;Therapeutic  activities;Therapeutic exercise;Neuromuscular re-education;Balance training;Gait training   PT Next Visit Plan review HEP; work on posture and alignment in standing; education re sitting positions and engaging core; core stabilization; LE stretching and strengthening; modalities as indicated    Consulted and Agree with Plan of Care Patient      Patient will benefit from skilled therapeutic intervention in order to improve the following deficits and impairments:  Postural dysfunction, Improper body mechanics, Abnormal gait, Increased fascial restricitons, Increased muscle spasms, Decreased strength, Decreased mobility, Decreased activity tolerance  Visit Diagnosis: Pain in right hip - Plan: PT plan of care cert/re-cert  Other symptoms and signs involving the musculoskeletal system - Plan: PT plan of care cert/re-cert  Other abnormalities of gait and mobility - Plan: PT plan of care cert/re-cert  Muscle weakness (generalized) - Plan: PT plan of care cert/re-cert       [facility] G-COD[facility] - [id number]    Functional Assessment Tool Used  Evaluation; FOTO; clinical assessment    Functional Limitations Mobility: Walking and moving around   Mobility: Walking and Moving Around Current Status At least 60 percent but less than 80 percent impaired, limited or restricted   Mobility: Walking and Moving Around Goal Status [phone] At least 40 percent but less than 60 percent impaired, limited or restricted      Problem List Patient Active Problem List   Diagnosis Date Noted  . Trochanteric bursitis of right hip 02/14/2017  . Family history of neoplasm of ovary 10/22/2016  . Opioid use disorder,  severe, dependence ([place]) 01/23/2016  . CRPS (complex regional pain syndrome), lower limb 07/19/2014  . Carpal tunnel syndrome 07/19/2014  . DDD (degenerative disc disease), lumbar 07/19/2014  . Postmenopausal atrophic vaginitis 12/31/2012  . Chronic pain disorder 03/14/2011  . OTHER ACUTE REACTIONS  TO STRESS 01/23/2010  . MENOPAUSE-RELATED VASOMOTOR SYMPTOMS 01/21/2007  . DEPRESSION, MAJOR, RECURRENT 03/25/2006  . Former smoker 03/25/2006    [place] PT, MPH  02/26/2017, 1:30 PM  [facility] [address] [place], [place], [address] Phone: [phone]   Fax:  [phone]  Name: Heather Dennis MRN: [id number] Date of Birth: [birth year]  PHYSICAL THERAPY DISCHARGE SUMMARY  Visits from Start of Care: evaluation only   Current functional level related to goals / functional outcomes: See progress note for discharge status    Remaining deficits: Unchanged    Education / Equipment: HEP  Plan: Patient agrees to discharge.  Patient goals were not met. Patient is being discharged due to not returning since the last visit.  ?????     Rozanna Cormany P. Helene Kelp PT, MPH 04/28/17 12:30 PM

## 2017-02-26 NOTE — Progress Notes (Signed)
   Heather Dennis is a 51 y.o. female who [facility] today for right lateral hip pain.  Patient has had one episode of physical therapy today for her lateral hip pain. She notes that she has some pain but overall is optimistic about continuing to get better. She denies any new injury.   Past Medical History:  Diagnosis Date  . Carpal tunnel syndrome 07/19/2014   Right, severe. [facility]05/13 [facility].    . Chronic pain disorder 03/14/2011  . CRPS (complex regional pain syndrome), lower limb 07/19/2014  . Opioid use disorder, severe, dependence (HCC) 01/23/2016  . PTSD (post-traumatic stress disorder)   . Smoker    Past Surgical History:  Procedure Laterality Date  . CESAREAN SECTION  1981 1985  . COSMETIC SURGERY  1995 2002   Breast Aug  . KNEE SURGERY  08/25/09   x 3  . TUBAL LIGATION  1991   Social History  Substance Use Topics  . Smoking status: Former Smoker    Packs/day: 1.00  . Smokeless tobacco: Never Used  . Alcohol use No     ROS:  As above   Medications: Current Outpatient Prescriptions  Medication Sig Dispense Refill  . acetaminophen (TYLENOL) 325 MG tablet Take 650 mg by mouth every 6 (six) hours as needed.    . clonazePAM (KLONOPIN) 0.5 MG tablet Take 0.5-1 tablets (0.25-0.5 mg total) by mouth daily as needed for anxiety. Pt reports that she is taking 0.25mg  30 tablet 0  . lidocaine (LIDODERM) 5 % Place 1 patch onto the skin daily. 30 patch 11  . lisinopril (PRINIVIL,ZESTRIL) 10 MG tablet Take 1 tablet (10 mg total) by mouth daily. 90 tablet 1  . zolpidem (AMBIEN) 5 MG tablet TAKE 1 TABLET BY MOUTH AT BEDTIME AS NEEDED FOR SLEEP 30 tablet 0   No current facility-administered medications for this visit.    Allergies  Allergen Reactions  . Gabapentin Other (See Comments)    headache  . Codeine Nausea Only     Exam:  BP (!) 125/107   Pulse 100   Wt 168 lb (76.2 kg)   BMI  27.96 kg/m  General: Well Developed, well nourished, and in no acute distress.  Neuro/Psych: Alert and oriented x3, extra-ocular muscles intact, able to move all 4 extremities, sensation grossly intact. Skin: Warm and dry, no rashes noted.  Respiratory: Not using accessory muscles, speaking in full sentences, trachea midline.  Cardiovascular: Pulses palpable, no extremity edema. Abdomen: Does not appear distended. MSK: Right lateral hip tender. Pain with ambulation present. Patient uses a cane to ambulate    No results found for this or any previous visit (from the past 48 hour(s)). No results found.    Assessment and Plan: 51 y.o. female with right lateral hip pain. Continue physical therapy recheck in 4 weeks.    No orders of the defined types were placed in this encounter.  No orders of the defined types were placed in this encounter.   Discussed warning signs or symptoms. Please see discharge instructions. Patient expresses understanding.  .I spent 20 minutes with this patient, greater than 50% was face-to-face time counseling regarding hip pain treatment options and prognosis..Marland Kitchen

## 2017-02-26 NOTE — Patient Instructions (Signed)
HIP: Hamstrings - Supine   Place strap around foot. Raise leg up, keeping knee straight.  Bend opposite knee to protect back if indicated. Hold 30 seconds. 3 reps per set, 2-3 sets per day     Outer Hip Stretch: Reclined IT Band Stretch (Strap)   Strap around one foot, pull leg across body until you feel a pull or stretch, with shoulders on mat. Hold for 30 seconds. Repeat 3 times each leg. 2-3 times/day.  Piriformis Stretch   Lying on back, pull right knee toward opposite shoulder. Hold 30 seconds. Repeat 3 times. Do 2-3 sessions per day.   Abdominal Bracing With Pelvic Floor (Hook-Lying)    With neutral spine, tighten pelvic floor and abdominals sucking belly button to back bone; tighten muscles in the low back; exhale.  Hold 10 Repeat _10__ times. Do __several _ times a day.

## 2017-02-26 NOTE — Patient Instructions (Addendum)
Thank you for coming in today. Continue PT.  Continue home exercises.  Recheck in 2-4 weeks.  Return sooner if needed.  Continue CBT (Cognative Behavioral Therapy).   I will ask Dr Judie PetitM about Ambien

## 2017-02-27 ENCOUNTER — Other Ambulatory Visit: Payer: Self-pay | Admitting: Family Medicine

## 2017-02-27 MED ORDER — ZOLPIDEM TARTRATE 5 MG PO TABS: 5.0000 mg | ORAL_TABLET | Freq: Every evening | ORAL | 1 refills | Status: DC | PRN

## 2017-02-27 NOTE — Telephone Encounter (Signed)
Pt informed.Heather Dennis [name]  

## 2017-03-05 ENCOUNTER — Encounter: Payer: Medicare HMO | Admitting: Physical Therapy

## 2017-03-05 ENCOUNTER — Other Ambulatory Visit: Payer: Self-pay | Admitting: Family Medicine

## 2017-03-05 DIAGNOSIS — R69 Illness, unspecified: Secondary | ICD-10-CM | POA: Diagnosis not present

## 2017-03-13 ENCOUNTER — Encounter: Payer: Medicare HMO | Admitting: Physical Therapy

## 2017-03-26 ENCOUNTER — Encounter: Payer: Self-pay | Admitting: Family Medicine

## 2017-03-26 ENCOUNTER — Ambulatory Visit (INDEPENDENT_AMBULATORY_CARE_PROVIDER_SITE_OTHER): Payer: Medicare HMO | Admitting: Family Medicine

## 2017-03-26 VITALS — BP 142/88 | HR 82

## 2017-03-26 DIAGNOSIS — M5136 Other intervertebral disc degeneration, lumbar region: Secondary | ICD-10-CM

## 2017-03-26 DIAGNOSIS — M7061 Trochanteric bursitis, right hip: Secondary | ICD-10-CM

## 2017-03-26 NOTE — Patient Instructions (Signed)
Thank you for coming in today. Do the exercise we discussed.  IT band Stretch Figure 4 stretch Do the side leg raises 30 reps 3x daily.   You should hear about back injections.   Recheck with me in 1 month.

## 2017-03-26 NOTE — Progress Notes (Signed)
Heather Dennis is a 51 y.o. female who presents to [facility] today for follow-up right lateral hip pain and discuss back pain.   Heather Dennis notes continued right lateral hip pain. She has not attended any further physical therapy sessions since her last visit. She has been doing some limited home exercise program which she thinks helps a little bit. She has continued bothersome right lateral hip pain worse with weightbearing and better with rest.  Additionally she notes continued low back pain. In the past she's had what sounds like facet injections. She has an MRI from 2017 showing degeneration of the facets at L4-L5 and L5-S1 bilaterally. She denies any new injury or worsening or severe pain or bowel bladder dysfunction.   Past Medical History:  Diagnosis Date  . Carpal tunnel syndrome 07/19/2014   Right, severe. EMG done 11/05/13 at [facility].    . Chronic pain disorder 03/14/2011  . CRPS (complex regional pain syndrome), lower limb 07/19/2014  . Opioid use disorder, severe, dependence (HCC) 01/23/2016  . PTSD (post-traumatic stress disorder)   . Smoker    Past Surgical History:  Procedure Laterality Date  . CESAREAN SECTION  1981 1985  . COSMETIC SURGERY  1995 2002   Breast Aug  . KNEE SURGERY  08/25/09   x 3  . TUBAL LIGATION  1991   Social History  Substance Use Topics  . Smoking status: Former Smoker    Packs/day: 1.00  . Smokeless tobacco: Never Used  . Alcohol use No     ROS:  As above   Medications: Current Outpatient Prescriptions  Medication Sig Dispense Refill  . acetaminophen (TYLENOL) 325 MG tablet Take 650 mg by mouth every 6 (six) hours as needed.    . clonazePAM (KLONOPIN) 0.5 MG tablet Take 0.5-1 tablets (0.25-0.5 mg total) by mouth daily as needed for anxiety. Pt reports that she is taking 0.25mg  30 tablet 0  . lidocaine (LIDODERM) 5 % Place 1 patch onto the skin daily. 30 patch 11  . lisinopril  (PRINIVIL,ZESTRIL) 10 MG tablet Take 1 tablet (10 mg total) by mouth daily. 90 tablet 1  . zolpidem (AMBIEN) 5 MG tablet Take 1 tablet (5 mg total) by mouth at bedtime as needed. for sleep 30 tablet 1   No current facility-administered medications for this visit.    Allergies  Allergen Reactions  . Gabapentin Other (See Comments)    headache  . Codeine Nausea Only     Exam:  BP (!) 142/88   Pulse 82  General: Well Developed, well nourished, and in no acute distress.  Neuro/Psych: Alert and oriented x3, extra-ocular muscles intact, able to move all 4 extremities, sensation grossly intact. Skin: Warm and dry, no rashes noted.  Respiratory: Not using accessory muscles, speaking in full sentences, trachea midline.  Cardiovascular: Pulses palpable, no extremity edema. Abdomen: Does not appear distended. MSK:  L-spine nontender to spinal midline. Normal flexion and extension.  Right hip normal-appearing Tender palpation right greater trochanter. Normal motion. Pain reproducible with stretching of the IT band and piriformis muscles. Strength is diminished 4/5 to hip abduction strength testing.  Result Narrative  [id number] Attending IO:NGEXB SPIVEY [phone] Ordering GM:WNUUV SPIVEY Date of Birth:05-23-1967Sex: F Admit Date:08/09/2015 12:28  ###FINAL RESULT###    INDICATIONS: INTENSE DAILY LOW BACK PAIN AND LEFT KNEE PAIN SINCE 08-17-09,  FALL COMMENTS:   PROCEDURE:QMR 1130- MRI L-SPINE WO CONT - Aug 09 2015  [employer] Accession #: [id number] DaVinci  Accession #: [id number]     MRI lumbar spine:  INDICATION: Low back pain and left knee pain.  TECHNIQUE: Sagittal and axial T1 and T2-weighted sequences were  performed. Additional sagittal STIR images were performed.  COMPARISON: None  FINDINGS: #Vertebral bodies: No compression fracture. #Alignment: Normal. #Marrow signal: No significant abnormality. #Conus medullaris: Normal. Terminates at  T12-L1 with no evidence of  tethering. #Lower thoracic segments: No significant abnormality. #   #L1-2: Normal. #L2-3: Normal. #L3-4: Normal. #L4-5: Moderate facet joint arthritis bilaterally. There is some  thickening of the ligamentum flavum. Mild spinal stenosis. #L5-S1: Mild degenerative disc disease. Left central disc protrusion  with mild impingement on the left S1 nerve root. Moderate facet joint  arthritis.    IMPRESSION: 1.Moderate facet joint arthritis at L4-5 and L5-S1. 2.Left central disc protrusion L5-S1. 3.Mild spinal stenosis L4-5.      No results found for this or any previous visit (from the past 48 hour(s)). No results found.    Assessment and Plan: 51 y.o. female with  Right lateral hip pain greater trochanteric bursitis. We discussed home exercise program. Patient will add IT band and figure for stretching as well as hip abduction strengthening with lateral leg raises. Plan to recheck in one month.  Lumbago chronic: Plan to repeat facet injections at L4-L5 and L5-S1 bilaterally.    No orders of the defined types were placed in this encounter.  No orders of the defined types were placed in this encounter.   Discussed warning signs or symptoms. Please see discharge instructions. Patient expresses understanding.

## 2017-03-31 DIAGNOSIS — R69 Illness, unspecified: Secondary | ICD-10-CM | POA: Diagnosis not present

## 2017-04-07 ENCOUNTER — Telehealth: Payer: Self-pay | Admitting: Family Medicine

## 2017-04-07 NOTE — Telephone Encounter (Signed)
Pt called on Friday.. She is needing MRI disc for an appointment with [facility] on 10/31. It is not up front in the folder.  Thank you

## 2017-04-07 NOTE — Telephone Encounter (Signed)
Left message with Provider to get disc.

## 2017-04-07 NOTE — Telephone Encounter (Signed)
Thank you :)

## 2017-04-07 NOTE — Telephone Encounter (Signed)
Disc in hand, Pt advised it is ready for pick up. No further questions.

## 2017-04-15 ENCOUNTER — Ambulatory Visit
Admission: RE | Admit: 2017-04-15 | Discharge: 2017-04-15 | Disposition: A | Discharge status: Home / Self Care | Payer: Medicare HMO | Source: Ambulatory Visit | Attending: Family Medicine | Admitting: Family Medicine

## 2017-04-15 DIAGNOSIS — M47817 Spondylosis without myelopathy or radiculopathy, lumbosacral region: Secondary | ICD-10-CM | POA: Diagnosis not present

## 2017-04-15 DIAGNOSIS — M5136 Other intervertebral disc degeneration, lumbar region: Secondary | ICD-10-CM

## 2017-04-15 MED ORDER — METHYLPREDNISOLONE ACETATE 40 MG/ML INJ SUSP (RADIOLOG
120.0000 mg | Freq: Once | INTRAMUSCULAR | Status: AC
  Administered 2017-04-15: 120 mg via INTRA_ARTICULAR

## 2017-04-15 MED ORDER — IOPAMIDOL (ISOVUE-M 200) INJECTION 41%
1.0000 mL | Freq: Once | INTRAMUSCULAR | Status: AC
  Administered 2017-04-15: 1 mL via INTRA_ARTICULAR

## 2017-04-15 NOTE — Discharge Instructions (Signed)

## 2017-04-16 DIAGNOSIS — R69 Illness, unspecified: Secondary | ICD-10-CM | POA: Diagnosis not present

## 2017-04-21 DIAGNOSIS — R69 Illness, unspecified: Secondary | ICD-10-CM | POA: Diagnosis not present

## 2017-04-25 ENCOUNTER — Ambulatory Visit (INDEPENDENT_AMBULATORY_CARE_PROVIDER_SITE_OTHER): Payer: Medicare HMO | Admitting: Family Medicine

## 2017-04-25 ENCOUNTER — Encounter: Payer: Self-pay | Admitting: Family Medicine

## 2017-04-25 VITALS — BP 104/56 | HR 77 | Wt 169.0 lb

## 2017-04-25 DIAGNOSIS — G894 Chronic pain syndrome: Secondary | ICD-10-CM

## 2017-04-25 DIAGNOSIS — M5136 Other intervertebral disc degeneration, lumbar region: Secondary | ICD-10-CM | POA: Diagnosis not present

## 2017-04-25 NOTE — Progress Notes (Signed)
Heather LimLinda K Mcadams is a 51 y.o. female who presents to [facility] today for back pain and right hip pain.  Heather Dennis has a complex medical, social and orthopedic history all combining to cause a great deal suffering.   I have been working on her back pain and right lateral hip pain thought to be due to lumbosacral degenerative disc disease and facet disease as well as right greater trochanteric bursitis. In late October she had facet injections which did not help much. In the past she's done pretty well with ablation to these facets. She notes continued back pain that causes suffering and disability. She is interested in proceeding with further ablation if possible. She notes that she has trouble affording and getting to physical therapy.   Past Medical History:  Diagnosis Date  . Carpal tunnel syndrome 07/19/2014   Right, severe. EMG done 11/05/13 at [facility].    . Chronic pain disorder 03/14/2011  . CRPS (complex regional pain syndrome), lower limb 07/19/2014  . Opioid use disorder, severe, dependence (HCC) 01/23/2016  . PTSD (post-traumatic stress disorder)   . Smoker    Past Surgical History:  Procedure Laterality Date  . CESAREAN SECTION  1981 1985  . COSMETIC SURGERY  1995 2002   Breast Aug  . KNEE SURGERY  08/25/09   x 3  . TUBAL LIGATION  1991   Social History   Tobacco Use  . Smoking status: Former Smoker    Packs/day: 1.00  . Smokeless tobacco: Never Used  Substance Use Topics  . Alcohol use: No    Alcohol/week: 0.0 oz     ROS:  As above   Medications: Current Outpatient Medications  Medication Sig Dispense Refill  . acetaminophen (TYLENOL) 325 MG tablet Take 650 mg by mouth every 6 (six) hours as needed.    . clonazePAM (KLONOPIN) 0.5 MG tablet Take 0.5-1 tablets (0.25-0.5 mg total) by mouth daily as needed for anxiety. Pt reports that she is taking 0.25mg  30 tablet 0  . lidocaine (LIDODERM) 5 % Place 1  patch onto the skin daily. 30 patch 11  . lisinopril (PRINIVIL,ZESTRIL) 10 MG tablet Take 1 tablet (10 mg total) by mouth daily. 90 tablet 1  . zolpidem (AMBIEN) 5 MG tablet Take 1 tablet (5 mg total) by mouth at bedtime as needed. for sleep 30 tablet 1   No current facility-administered medications for this visit.    Allergies  Allergen Reactions  . Codeine Nausea Only  . Gabapentin Other (See Comments)    headache     Exam:  BP (!) 104/56   Pulse 77   Wt 169 lb (76.7 kg)   SpO2 97%   BMI 28.12 kg/m  General: Well Developed, well nourished, and in no acute distress.  Neuro/Psych: Alert and oriented x3, extra-ocular muscles intact, able to move all 4 extremities, sensation grossly intact. Skin: Warm and dry, no rashes noted.  Respiratory: Not using accessory muscles, speaking in full sentences, trachea midline.  Cardiovascular: Pulses palpable, no extremity edema. Abdomen: Does not appear distended. MSK:  L-spine: Wearing a brace today. Poor postural control. Significant antalgic gait using a cane.    No results found for this or any previous visit (from the past 48 hour(s)). No results found.    Assessment and Plan: 51 y.o. female with back pain. Chronic likely combined degenerative disc disease, facet arthritis and lumbosacral myofascial dysfunction and pain.  Unfortunately Heather Dennis did not have much results  with facet injections. Based on her prior good response to ablation I think is reasonable to do a trial of the medial blanch block followed by a successful ablation of bilateral L4-L5 and L5-S1. Additionally I do encourage physical therapy although Heather Dennis essentially cannot afford to go.  We spent time discussing the nature of suffering and the medical establishment inability to provide great pain relief for her. I don't have any good options aside from what we have discussed previously. I feel bad for [facility]. I think continued cognitive behavioral therapy is reasonable as  this has been shown to decrease suffering from chronic back pain.  Recheck in one month.    No orders of the defined types were placed in this encounter.  No orders of the defined types were placed in this encounter.   Discussed warning signs or symptoms. Please see discharge instructions. Patient expresses understanding.  I spent 25 minutes with this patient, greater than 50% was face-to-face time counseling regarding ddx and treatment plan.

## 2017-04-25 NOTE — Patient Instructions (Signed)
Thank you for coming in today. I recommend continuing to see a therapist.  I also recommend physical therapy.  We will proceed with nerve ablation.  Let me know if you dont hear from [facility].  Recheck sooner if

## 2017-04-28 DIAGNOSIS — R69 Illness, unspecified: Secondary | ICD-10-CM | POA: Diagnosis not present

## 2017-05-07 ENCOUNTER — Other Ambulatory Visit: Payer: Self-pay | Admitting: Family Medicine

## 2017-05-07 DIAGNOSIS — G894 Chronic pain syndrome: Secondary | ICD-10-CM

## 2017-05-07 DIAGNOSIS — R69 Illness, unspecified: Secondary | ICD-10-CM | POA: Diagnosis not present

## 2017-05-07 DIAGNOSIS — M5136 Other intervertebral disc degeneration, lumbar region: Secondary | ICD-10-CM

## 2017-05-14 ENCOUNTER — Other Ambulatory Visit: Payer: Self-pay | Admitting: *Deleted

## 2017-05-14 DIAGNOSIS — G4700 Insomnia, unspecified: Secondary | ICD-10-CM

## 2017-05-15 MED ORDER — ZOLPIDEM TARTRATE 5 MG PO TABS: 5.0000 mg | ORAL_TABLET | Freq: Every evening | ORAL | 2 refills | Status: AC | PRN

## 2017-05-16 ENCOUNTER — Telehealth: Payer: Self-pay | Admitting: Family Medicine

## 2017-05-16 NOTE — Telephone Encounter (Signed)
Spoke to patient advised her as noted below. Thoren Hosang,CMA  

## 2017-05-16 NOTE — Telephone Encounter (Signed)
Please inform Ms Heather Dennis that I am doing a "Peer to Peer" to get the back injections authorized. This is scheduled for Monday

## 2017-05-19 DIAGNOSIS — R69 Illness, unspecified: Secondary | ICD-10-CM | POA: Diagnosis not present

## 2017-05-19 NOTE — Telephone Encounter (Signed)
Heather Dennis had improvement with the duration of the local with prior facet injection.  Plan to proceed with Medial Branch Block as scheduled.  If significant benefit will proceed with RFA.   Auth: Code: [id number]: [id number]

## 2017-05-19 NOTE — Telephone Encounter (Signed)
PATIENT HAS BEEN INFORMED AND SHE HAS AN APPOINTMENT FOR Friday. Rhonda Cunningham,CMA

## 2017-05-20 DIAGNOSIS — R69 Illness, unspecified: Secondary | ICD-10-CM | POA: Diagnosis not present

## 2017-05-22 ENCOUNTER — Telehealth: Payer: Self-pay

## 2017-05-22 MED ORDER — PROMETHAZINE HCL 25 MG PO TABS: 25.0000 mg | ORAL_TABLET | Freq: Three times a day (TID) | ORAL | 0 refills | Status: AC | PRN

## 2017-05-22 NOTE — Telephone Encounter (Signed)
I see the phenergan tablets. Did you want patient to take anything different than Pepto?

## 2017-05-22 NOTE — Telephone Encounter (Signed)
No, ok to take the pepto for the diarrhea or can use immodium. Can use the phenergan for the diarrhea. If not better then needs appt.

## 2017-05-22 NOTE — Telephone Encounter (Signed)
Heather Dennis states she has had vomiting and diarrhea for the last three days. She is taking Pepto. She took 3 doses this morning. She has had 3 episodes of diarrhea this morning. Denies fever, chills or sweats. She does report feeling cold then hot. She would like recommendations for the vomiting and diarrhea.

## 2017-05-23 ENCOUNTER — Other Ambulatory Visit: Payer: Medicare HMO

## 2017-05-23 NOTE — Telephone Encounter (Signed)
Patient advised of recommendations.  

## 2017-05-26 ENCOUNTER — Ambulatory Visit: Payer: Medicare HMO | Admitting: Family Medicine

## 2017-06-27 ENCOUNTER — Other Ambulatory Visit: Payer: Self-pay | Admitting: *Deleted

## 2017-06-27 MED ORDER — LISINOPRIL 10 MG PO TABS: 10.0000 mg | ORAL_TABLET | Freq: Every day | ORAL | 1 refills | Status: DC

## 2017-06-30 DIAGNOSIS — R69 Illness, unspecified: Secondary | ICD-10-CM | POA: Diagnosis not present

## 2017-07-03 DIAGNOSIS — R69 Illness, unspecified: Secondary | ICD-10-CM | POA: Diagnosis not present

## 2017-07-17 ENCOUNTER — Other Ambulatory Visit: Payer: Self-pay | Admitting: Family Medicine

## 2017-07-17 ENCOUNTER — Ambulatory Visit
Admission: RE | Admit: 2017-07-17 | Discharge: 2017-07-17 | Disposition: A | Discharge status: Home / Self Care | Payer: Medicare HMO | Source: Ambulatory Visit | Attending: Family Medicine | Admitting: Family Medicine

## 2017-07-17 DIAGNOSIS — M5136 Other intervertebral disc degeneration, lumbar region: Secondary | ICD-10-CM

## 2017-07-17 DIAGNOSIS — G894 Chronic pain syndrome: Secondary | ICD-10-CM

## 2017-07-17 DIAGNOSIS — M51369 Other intervertebral disc degeneration, lumbar region without mention of lumbar back pain or lower extremity pain: Secondary | ICD-10-CM

## 2017-07-17 DIAGNOSIS — M545 Low back pain: Secondary | ICD-10-CM | POA: Diagnosis not present

## 2017-07-17 NOTE — Discharge Instructions (Signed)

## 2017-07-21 DIAGNOSIS — R69 Illness, unspecified: Secondary | ICD-10-CM | POA: Diagnosis not present

## 2017-07-25 ENCOUNTER — Ambulatory Visit
Admission: RE | Admit: 2017-07-25 | Discharge: 2017-07-25 | Disposition: A | Discharge status: Home / Self Care | Payer: Medicare HMO | Source: Ambulatory Visit | Attending: Family Medicine | Admitting: Family Medicine

## 2017-07-25 ENCOUNTER — Other Ambulatory Visit: Payer: Self-pay | Admitting: Family Medicine

## 2017-07-25 DIAGNOSIS — M5387 Other specified dorsopathies, lumbosacral region: Secondary | ICD-10-CM | POA: Diagnosis not present

## 2017-07-25 DIAGNOSIS — M5136 Other intervertebral disc degeneration, lumbar region: Secondary | ICD-10-CM

## 2017-07-25 DIAGNOSIS — G894 Chronic pain syndrome: Secondary | ICD-10-CM

## 2017-07-25 DIAGNOSIS — M51369 Other intervertebral disc degeneration, lumbar region without mention of lumbar back pain or lower extremity pain: Secondary | ICD-10-CM

## 2017-07-25 MED ORDER — FENTANYL CITRATE (PF) 100 MCG/2ML IJ SOLN
25.0000 ug | INTRAMUSCULAR | Status: DC | PRN
  Administered 2017-07-25: 25 ug via INTRAVENOUS

## 2017-07-25 MED ORDER — METHYLPREDNISOLONE ACETATE 40 MG/ML INJ SUSP (RADIOLOG
120.0000 mg | Freq: Once | INTRAMUSCULAR | Status: AC
  Administered 2017-07-25: 120 mg via INTRALESIONAL

## 2017-07-25 MED ORDER — KETOROLAC TROMETHAMINE 30 MG/ML IJ SOLN
30.0000 mg | Freq: Once | INTRAMUSCULAR | Status: AC
  Administered 2017-07-25: 30 mg via INTRAVENOUS

## 2017-07-25 MED ORDER — MIDAZOLAM HCL 2 MG/2ML IJ SOLN
1.0000 mg | INTRAMUSCULAR | Status: DC | PRN
  Administered 2017-07-25 (×2): 1 mg via INTRAVENOUS

## 2017-07-25 MED ORDER — SODIUM CHLORIDE 0.9 % IV SOLN
Freq: Once | INTRAVENOUS | Status: AC
  Administered 2017-07-25: 08:00:00 via INTRAVENOUS

## 2017-07-25 NOTE — Progress Notes (Signed)
Pt reports taking Klonopin prior to arrival to decrease anxiety.

## 2017-07-25 NOTE — Progress Notes (Signed)
Pt tolerated procedure well. Pt is awake, alert and speaking in complete sentences.

## 2017-07-25 NOTE — Discharge Instructions (Signed)
Radio Frequency Ablation Post Procedure Discharge Instructions ° °1. May resume a regular diet and any medications that you routinely take (including pain medications). °2. No driving day of procedure. °3. Upon discharge go home and rest for at least 4 hours.  May use an ice pack as needed to injection sites on back. °4. Remove bandades later, today. ° ° ° °Please contact our office at [phone] for the following symptoms: ° °· Fever greater than 100 degrees °· Increased swelling, pain, or redness at injection site. ° ° °Thank you for visiting [facility]. °

## 2017-07-31 DIAGNOSIS — R69 Illness, unspecified: Secondary | ICD-10-CM | POA: Diagnosis not present

## 2017-08-13 ENCOUNTER — Encounter: Payer: Self-pay | Admitting: Family Medicine

## 2017-10-28 DIAGNOSIS — R69 Illness, unspecified: Secondary | ICD-10-CM | POA: Diagnosis not present

## 2017-12-16 ENCOUNTER — Other Ambulatory Visit: Payer: Self-pay | Admitting: Family Medicine

## 2017-12-31 ENCOUNTER — Other Ambulatory Visit: Payer: Self-pay | Admitting: *Deleted

## 2017-12-31 DIAGNOSIS — Z1231 Encounter for screening mammogram for malignant neoplasm of breast: Secondary | ICD-10-CM

## 2018-03-20 ENCOUNTER — Other Ambulatory Visit: Payer: Self-pay | Admitting: Family Medicine

## 2018-04-29 DIAGNOSIS — Z1211 Encounter for screening for malignant neoplasm of colon: Secondary | ICD-10-CM | POA: Diagnosis not present

## 2018-04-29 DIAGNOSIS — R69 Illness, unspecified: Secondary | ICD-10-CM | POA: Diagnosis not present

## 2018-04-29 DIAGNOSIS — Z0000 Encounter for general adult medical examination without abnormal findings: Secondary | ICD-10-CM | POA: Diagnosis not present

## 2018-04-29 DIAGNOSIS — I10 Essential (primary) hypertension: Secondary | ICD-10-CM | POA: Diagnosis not present

## 2018-04-29 DIAGNOSIS — Z1239 Encounter for other screening for malignant neoplasm of breast: Secondary | ICD-10-CM | POA: Diagnosis not present

## 2018-04-29 DIAGNOSIS — Z6829 Body mass index (BMI) 29.0-29.9, adult: Secondary | ICD-10-CM | POA: Diagnosis not present

## 2018-05-19 DIAGNOSIS — Z1211 Encounter for screening for malignant neoplasm of colon: Secondary | ICD-10-CM | POA: Diagnosis not present

## 2018-05-19 DIAGNOSIS — Z1212 Encounter for screening for malignant neoplasm of rectum: Secondary | ICD-10-CM | POA: Diagnosis not present

## 2018-11-02 IMAGING — DX DG HIP (WITH OR WITHOUT PELVIS) 2-3V*R*
3 series · 3 of 3 positions shown · non-contrast
Comparison: None.

CLINICAL DATA: Right lateral hip pain for 6 months.  No injury.

EXAM:
DG HIP (WITH OR WITHOUT PELVIS) 2-3V RIGHT

[pelvis ap]
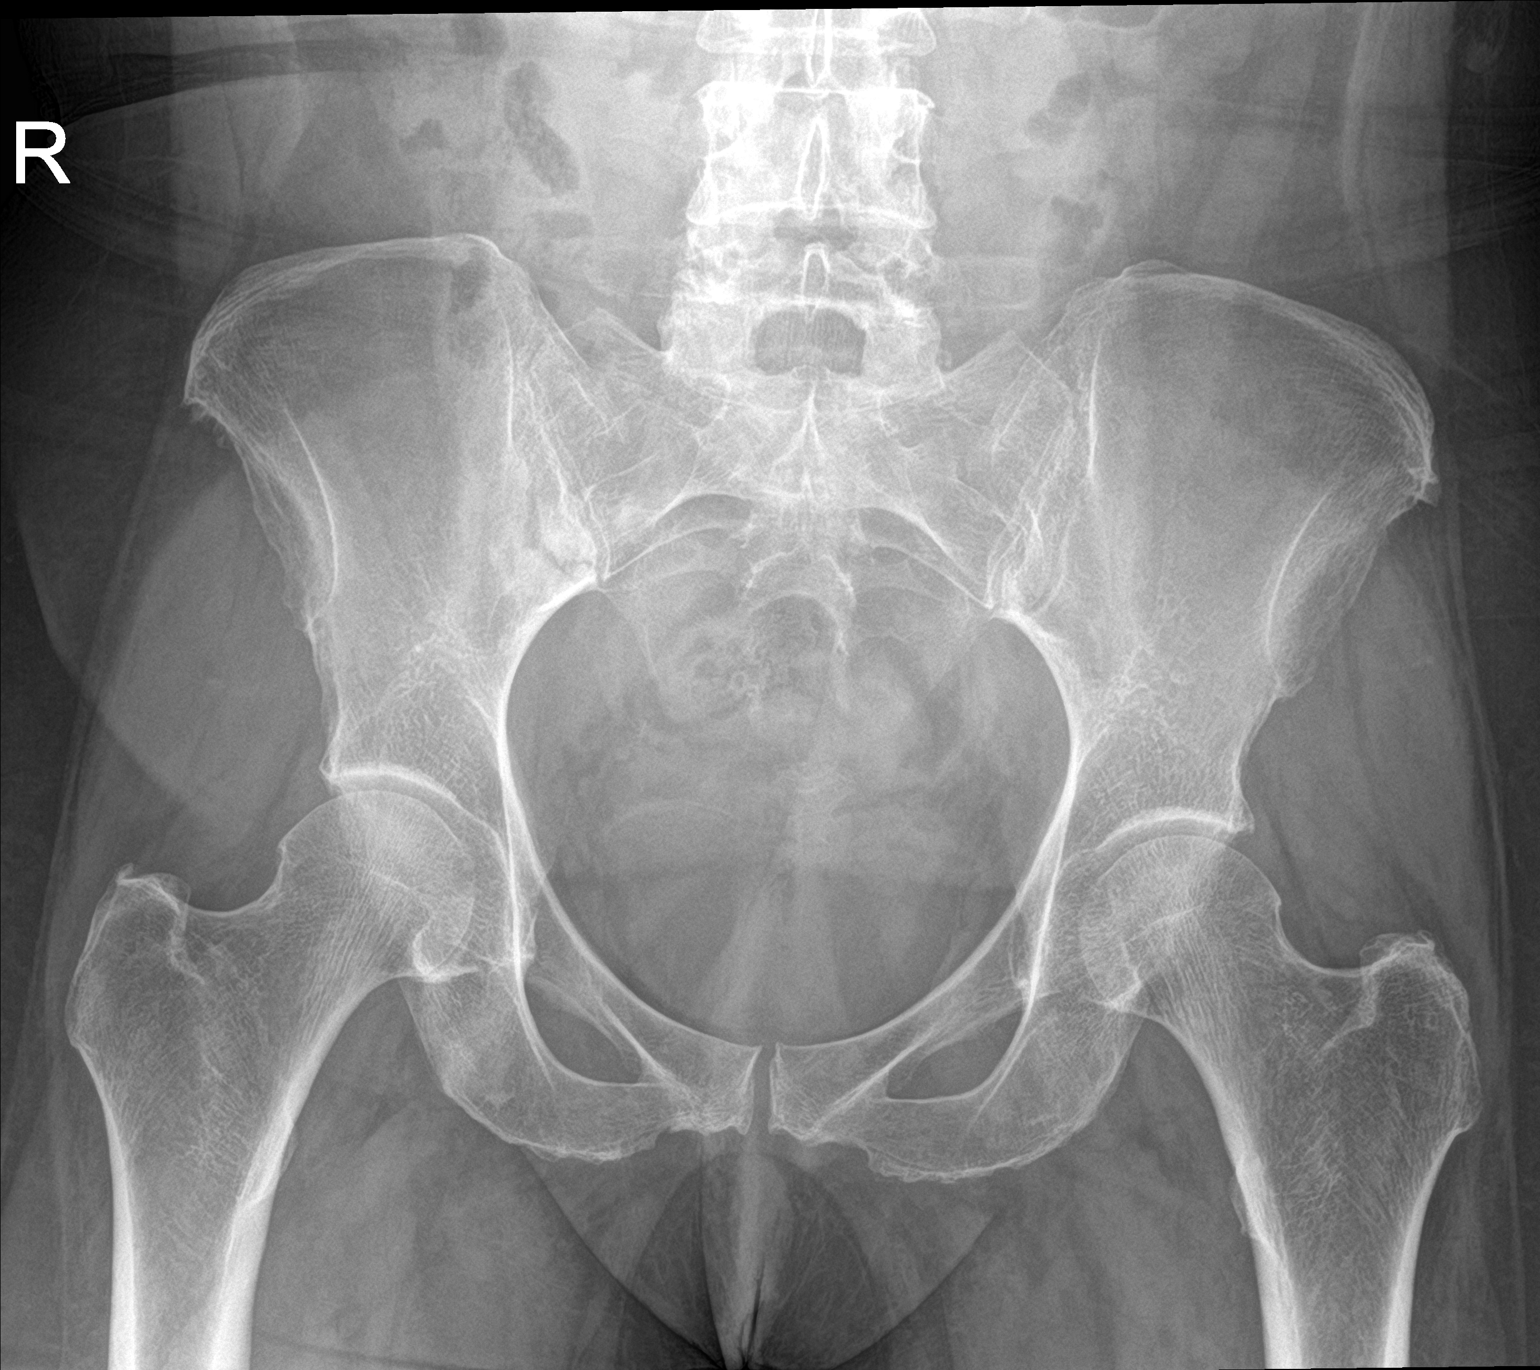

[hip ap]
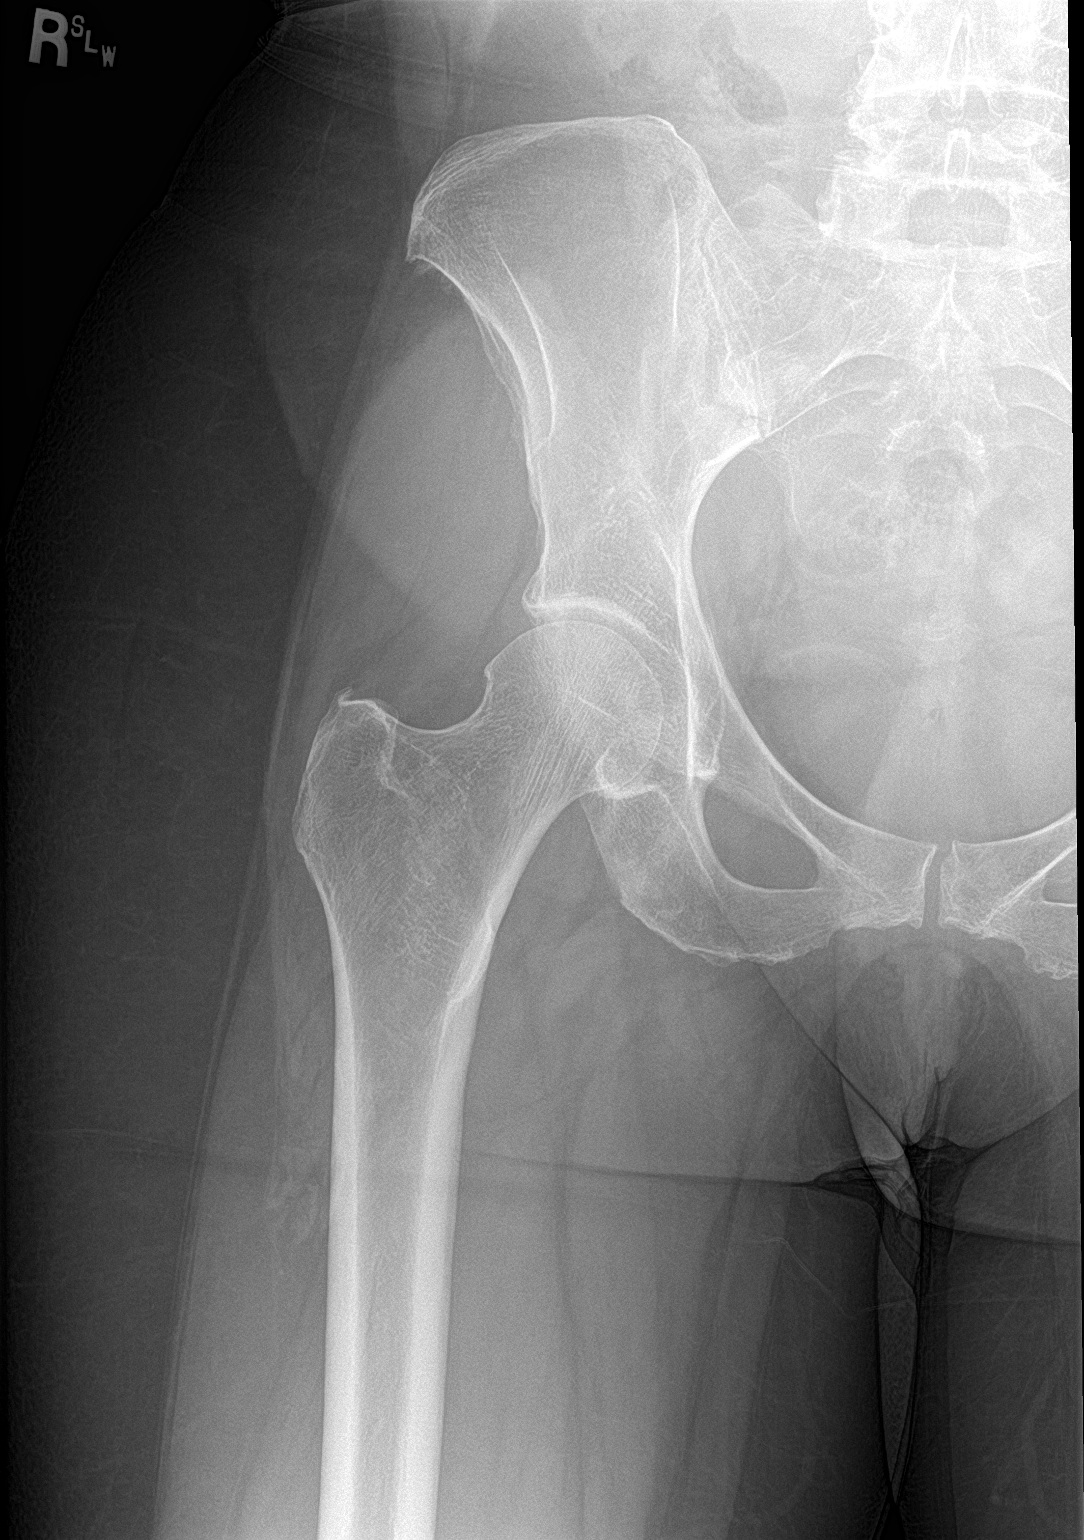

[hip lat]
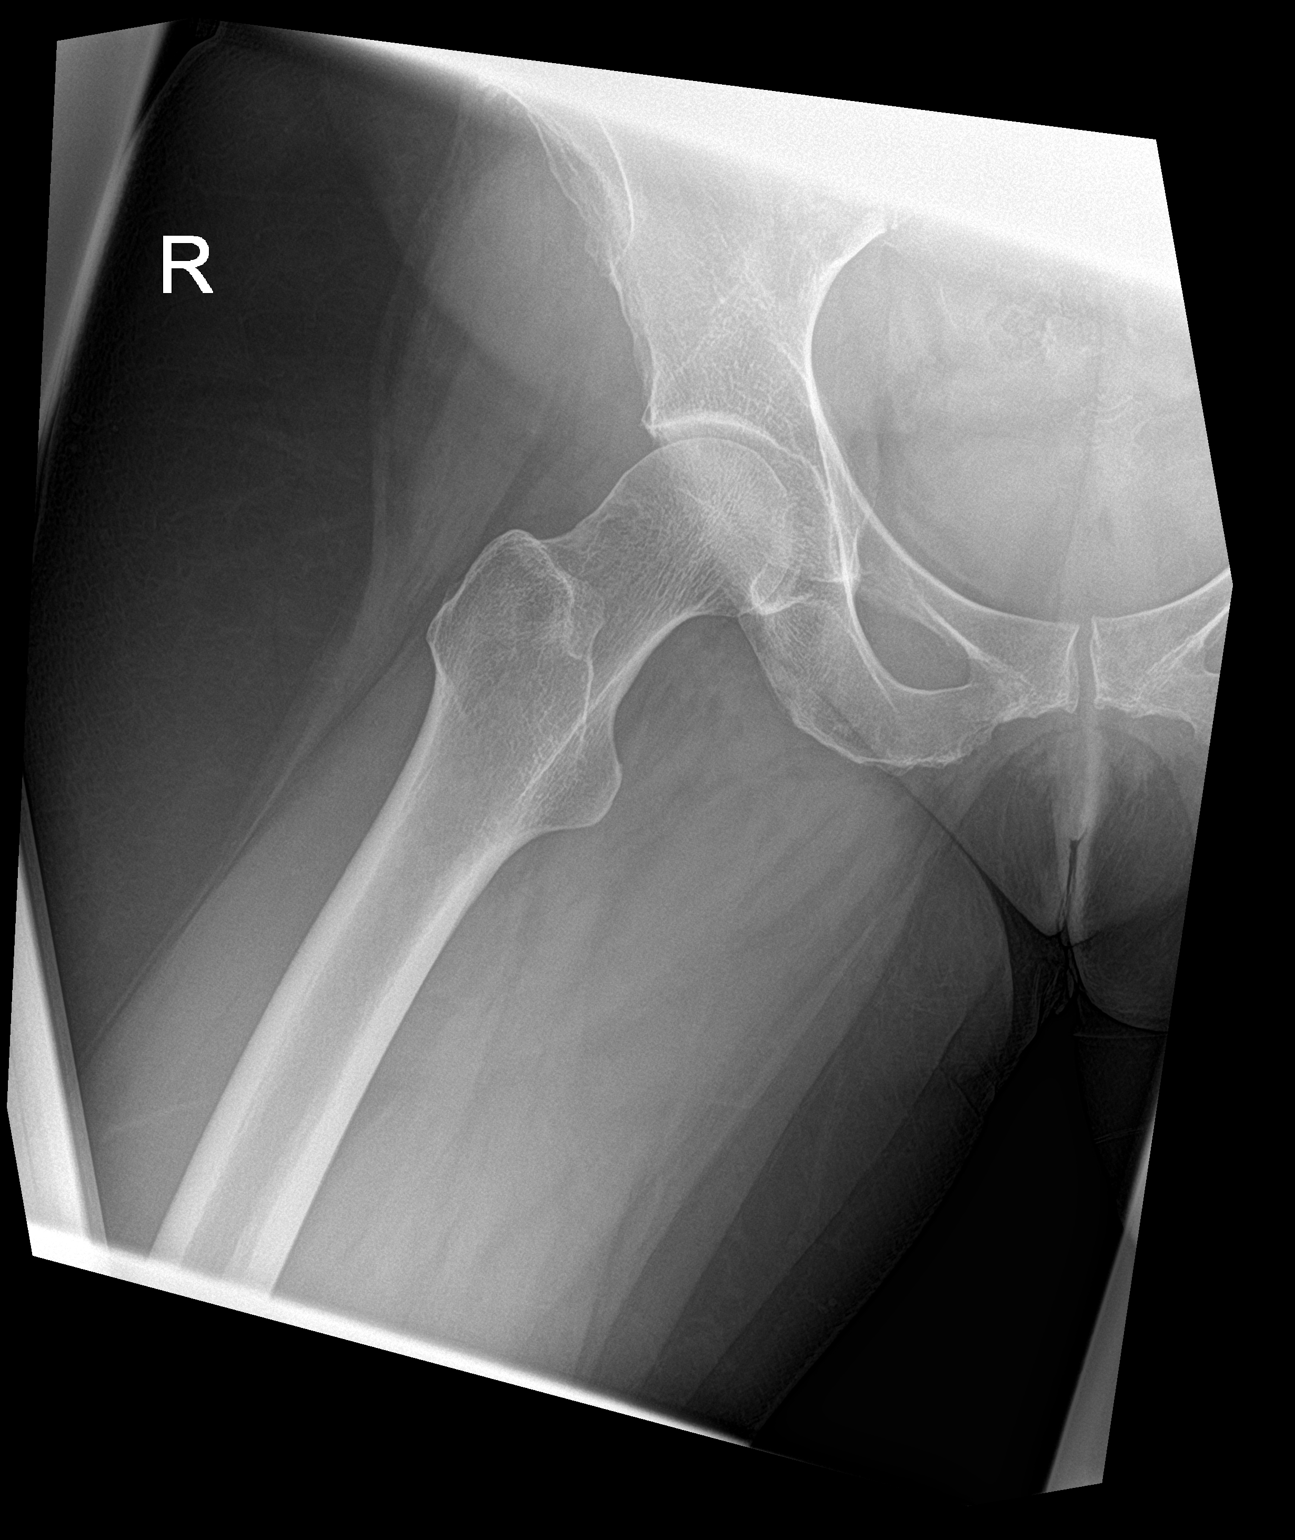

[3 of 3 positions shown; findings below may reference images not displayed]

FINDINGS: No acute fracture or malalignment. The bilateral hip joint spaces
are preserved. Mild degenerative changes of the bilateral sacroiliac
joints and pubic symphysis. No focal osseous abnormality. Diffuse
osteopenia. Soft tissues are unremarkable.
IMPRESSION: 1.  No acute osseous abnormality.
2. Diffuse osteopenia.
